# Patient Record
Sex: Male | Born: 1960 | Race: White | Hispanic: No | Marital: Married | Smoking: Former smoker
Health system: Southern US, Community
[De-identification: ages and names within clinical notes are randomized; demographics above are authoritative.]

## PROBLEM LIST (undated history)

## (undated) DIAGNOSIS — D126 Benign neoplasm of colon, unspecified: Secondary | ICD-10-CM

## (undated) DIAGNOSIS — M545 Low back pain, unspecified: Secondary | ICD-10-CM

## (undated) DIAGNOSIS — K5790 Diverticulosis of intestine, part unspecified, without perforation or abscess without bleeding: Secondary | ICD-10-CM

## (undated) DIAGNOSIS — K219 Gastro-esophageal reflux disease without esophagitis: Secondary | ICD-10-CM

## (undated) DIAGNOSIS — N419 Inflammatory disease of prostate, unspecified: Secondary | ICD-10-CM

## (undated) DIAGNOSIS — E785 Hyperlipidemia, unspecified: Secondary | ICD-10-CM

## (undated) DIAGNOSIS — E039 Hypothyroidism, unspecified: Secondary | ICD-10-CM

## (undated) DIAGNOSIS — K649 Unspecified hemorrhoids: Secondary | ICD-10-CM

## (undated) DIAGNOSIS — T7840XA Allergy, unspecified, initial encounter: Secondary | ICD-10-CM

## (undated) DIAGNOSIS — Q393 Congenital stenosis and stricture of esophagus: Secondary | ICD-10-CM

## (undated) HISTORY — DX: Low back pain: M54.5

## (undated) HISTORY — DX: Allergy, unspecified, initial encounter: T78.40XA

## (undated) HISTORY — DX: Gastro-esophageal reflux disease without esophagitis: K21.9

## (undated) HISTORY — DX: Congenital stenosis and stricture of esophagus: Q39.3

## (undated) HISTORY — DX: Benign neoplasm of colon, unspecified: D12.6

## (undated) HISTORY — DX: Inflammatory disease of prostate, unspecified: N41.9

## (undated) HISTORY — DX: Diverticulosis of intestine, part unspecified, without perforation or abscess without bleeding: K57.90

## (undated) HISTORY — DX: Unspecified hemorrhoids: K64.9

## (undated) HISTORY — DX: Hyperlipidemia, unspecified: E78.5

## (undated) HISTORY — DX: Hypothyroidism, unspecified: E03.9

## (undated) HISTORY — DX: Low back pain, unspecified: M54.50

---

## 2005-09-24 ENCOUNTER — Emergency Department (HOSPITAL_COMMUNITY): Admission: EM | Admit: 2005-09-24 | Discharge: 2005-09-24 | Payer: Self-pay | Admitting: Emergency Medicine

## 2005-10-26 ENCOUNTER — Emergency Department (HOSPITAL_COMMUNITY): Admission: EM | Admit: 2005-10-26 | Discharge: 2005-10-26 | Payer: Self-pay | Admitting: Emergency Medicine

## 2006-03-07 HISTORY — PX: SHOULDER SURGERY: SHX246

## 2007-04-12 ENCOUNTER — Ambulatory Visit (HOSPITAL_COMMUNITY): Admission: RE | Admit: 2007-04-12 | Discharge: 2007-04-12 | Payer: Self-pay | Admitting: Orthopedic Surgery

## 2009-08-05 DIAGNOSIS — E785 Hyperlipidemia, unspecified: Secondary | ICD-10-CM

## 2009-08-05 HISTORY — DX: Hyperlipidemia, unspecified: E78.5

## 2009-08-17 ENCOUNTER — Ambulatory Visit: Payer: Self-pay | Admitting: Internal Medicine

## 2009-08-17 DIAGNOSIS — S46909A Unspecified injury of unspecified muscle, fascia and tendon at shoulder and upper arm level, unspecified arm, initial encounter: Secondary | ICD-10-CM | POA: Insufficient documentation

## 2009-08-17 DIAGNOSIS — S4980XA Other specified injuries of shoulder and upper arm, unspecified arm, initial encounter: Secondary | ICD-10-CM | POA: Insufficient documentation

## 2009-08-17 DIAGNOSIS — N410 Acute prostatitis: Secondary | ICD-10-CM | POA: Insufficient documentation

## 2009-08-17 DIAGNOSIS — M545 Low back pain, unspecified: Secondary | ICD-10-CM | POA: Insufficient documentation

## 2009-08-17 DIAGNOSIS — J309 Allergic rhinitis, unspecified: Secondary | ICD-10-CM | POA: Insufficient documentation

## 2009-09-01 ENCOUNTER — Ambulatory Visit: Payer: Self-pay | Admitting: Internal Medicine

## 2009-09-01 LAB — CONVERTED CEMR LAB
OCCULT 1: NEGATIVE
OCCULT 2: NEGATIVE

## 2009-09-02 ENCOUNTER — Ambulatory Visit: Payer: Self-pay | Admitting: Internal Medicine

## 2009-09-02 ENCOUNTER — Encounter (INDEPENDENT_AMBULATORY_CARE_PROVIDER_SITE_OTHER): Payer: Self-pay | Admitting: *Deleted

## 2009-09-14 ENCOUNTER — Ambulatory Visit: Payer: Self-pay | Admitting: Internal Medicine

## 2009-09-14 DIAGNOSIS — E039 Hypothyroidism, unspecified: Secondary | ICD-10-CM | POA: Insufficient documentation

## 2009-09-14 DIAGNOSIS — E785 Hyperlipidemia, unspecified: Secondary | ICD-10-CM | POA: Insufficient documentation

## 2009-09-14 LAB — CONVERTED CEMR LAB
HDL goal, serum: 40 mg/dL
LDL Goal: 160 mg/dL

## 2009-09-23 ENCOUNTER — Encounter: Payer: Self-pay | Admitting: Internal Medicine

## 2009-11-23 ENCOUNTER — Ambulatory Visit: Payer: Self-pay | Admitting: Internal Medicine

## 2010-02-26 ENCOUNTER — Ambulatory Visit: Payer: Self-pay | Admitting: Internal Medicine

## 2010-02-26 DIAGNOSIS — M549 Dorsalgia, unspecified: Secondary | ICD-10-CM | POA: Insufficient documentation

## 2010-02-26 LAB — CONVERTED CEMR LAB
Bilirubin Urine: NEGATIVE
Glucose, Urine, Semiquant: NEGATIVE
Ketones, urine, test strip: NEGATIVE
Specific Gravity, Urine: <1.005
pH: 5

## 2010-03-02 LAB — CONVERTED CEMR LAB: TSH: 1.75 microintl units/mL (ref 0.35–5.50)

## 2010-04-04 LAB — CONVERTED CEMR LAB
Albumin: 4.4 g/dL (ref 3.5–5.2)
BUN: 18 mg/dL (ref 6–23)
Basophils Absolute: 0 10*3/uL (ref 0.0–0.1)
Basophils Relative: 0.7 % (ref 0.0–3.0)
Bilirubin, Direct: 0.1 mg/dL (ref 0.0–0.3)
CO2: 31 meq/L (ref 19–32)
Calcium: 9.4 mg/dL (ref 8.4–10.5)
Chloride: 106 meq/L (ref 96–112)
Cholesterol: 193 mg/dL (ref 0–200)
Creatinine, Ser: 1 mg/dL (ref 0.4–1.5)
Eosinophils Absolute: 0.3 10*3/uL (ref 0.0–0.7)
Glucose, Bld: 88 mg/dL (ref 70–99)
HDL: 49.4 mg/dL (ref >39.00)
Lymphocytes Relative: 24.4 % (ref 12.0–46.0)
MCHC: 34.6 g/dL (ref 30.0–36.0)
MCV: 89.7 fL (ref 78.0–100.0)
Monocytes Absolute: 0.4 10*3/uL (ref 0.1–1.0)
Neutro Abs: 3.4 10*3/uL (ref 1.4–7.7)
Neutrophils Relative %: 63 % (ref 43.0–77.0)
PSA: 2.1 ng/mL (ref 0.10–4.00)
RBC: 4.71 M/uL (ref 4.22–5.81)
RDW: 13.8 % (ref 11.5–14.6)
Total CHOL/HDL Ratio: 4
Total Protein: 7 g/dL (ref 6.0–8.3)
Triglycerides: 78 mg/dL (ref 0.0–149.0)

## 2010-04-06 NOTE — Letter (Signed)
Summary: [facility]  [facility]   Imported By: Lanelle Bal 10/06/2009 11:34:48  _____________________________________________________________________  External Attachment:    Type:   Image     Comment:   External Document

## 2010-04-06 NOTE — Assessment & Plan Note (Signed)
Summary: FOLLOWUP LABS/KN   Vital Signs:  Patient profile:   50 year old male Weight:      172.8 pounds Pulse rate:   54 / minute Resp:     14 per minute BP sitting:   116 / 70  (left arm) Cuff size:   large  Vitals Entered By: Shonna Chock (September 14, 2009 9:07 AM) CC: Follow-up visit: discuss labs , Lipid Management   CC:  Follow-up visit: discuss labs  and Lipid Management.  History of Present Illness: Labs reviewed ; TSH has climbed from 6.36 to 9.22. Free T4 0.78.Weight up 3 # since last appt. See ROS concerning endocrine symptoms/ signs.  Lipid Management History:      Positive NCEP/ATP III risk factors include male age 62 years old or older.  Negative NCEP/ATP III risk factors include non-diabetic, no family history for ischemic heart disease, non-tobacco-user status, non-hypertensive, no ASHD (atherosclerotic heart disease), no prior stroke/TIA, no peripheral vascular disease, and no history of aortic aneurysm.     Allergies (verified): No Known Drug Allergies  Past History:  Past Medical History: Allergic rhinitis Prostatitis , PMH of, Dr Darvin Neighbours HyperlipidemiaLDL 128,HDL 49.40,TG 78, TC 193;[place] Study LDL goal = < 160.  Review of Systems General:  Complains of fatigue. Eyes:  Denies blurring, double vision, and vision loss-both eyes. ENT:  Denies hoarseness; Slight swallowing issues, even saliva. Rare food dysphagia.. CV:  Denies palpitations. GI:  Complains of constipation; denies diarrhea. Derm:  Denies changes in nail beds, dryness, and hair loss. Neuro:  Denies numbness and tingling. Endo:  Denies cold intolerance and heat intolerance.  Physical Exam  General:  Thin but well-nourished; alert,appropriate and cooperative throughout examination Eyes:  No corneal or conjunctival inflammation noted. Perrla.Minimal lid lag . FOV WNL Neck:  No deformities, masses, or tenderness noted. Thyroid ULN size w/o nodules Heart:  regular rhythm, no murmur, no  gallop, no rub, no JVD, and bradycardia.   Neurologic:  alert & oriented X3 and DTRs symmetrical and 1/2+ Skin:  Intact without suspicious lesions or rashes Psych:  memory intact for recent and remote, normally interactive, and good eye contact.     Impression & Recommendations:  Problem # 1:  HYPOTHYROIDISM (ICD-244.9)  His updated medication list for this problem includes:    Levothyroxine Sodium 25 Mcg Tabs (Levothyroxine sodium) .Marland Kitchen... 1 once daily  Complete Medication List: 1)  Levothyroxine Sodium 25 Mcg Tabs (Levothyroxine sodium) .Marland Kitchen.. 1 once daily  Lipid Assessment/Plan:      Based on NCEP/ATP III, the patient's risk factor category is "0-1 risk factors".  The patient's lipid goals are as follows: Total cholesterol goal is 200; LDL cholesterol goal is 160; HDL cholesterol goal is 40; Triglyceride goal is 150.  His LDL cholesterol goal has been met.    Patient Instructions: 1)  Please schedule a follow-up Lab  appointment in  10 weeks for a 2)  TSH , ICD-9:244.9. TSH goal = 1-3 Prescriptions: LEVOTHYROXINE SODIUM 25 MCG TABS (LEVOTHYROXINE SODIUM) 1 once daily  #90 x 1   Entered and Authorized by:   Marga Melnick MD   Signed by:   Marga Melnick MD on 09/14/2009   Method used:   Print then Give to Patient   RxID:   [id number]

## 2010-04-06 NOTE — Letter (Signed)
Summary: [facility]  [facility]   Imported By: Lanelle Bal 10/01/2009 12:05:24  _____________________________________________________________________  External Attachment:    Type:   Image     Comment:   External Document
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
## 2010-04-06 NOTE — Letter (Signed)
Summary: Results Follow up Letter  [place] [facility]  [address] [place], [place] [address]   Phone: [phone]  Fax: [phone]    09/02/2009 MRN: [id number]  [facility] [address] [place], [place]  [address]  Dear Gregory Webb,  The following are the results of your recent test(s):  Test         Result    Pap Smear:        Normal _____  Not Normal _____ Comments: ______________________________________________________ Cholesterol: LDL(Bad cholesterol):         Your goal is less than:         HDL (Good cholesterol):       Your goal is more than: Comments:  ______________________________________________________ Mammogram:        Normal _____  Not Normal _____ Comments:  ___________________________________________________________________ Hemoccult:        Normal _X__  Not normal _______ Comments:    _____________________________________________________________________ Other Tests:    We routinely do not discuss normal results over the telephone.  If you desire a copy of the results, or you have any questions about this information we can discuss them at your next office visit.   Sincerely,

## 2010-04-08 NOTE — Assessment & Plan Note (Signed)
Summary: lower back pain//ph   Vital Signs:  Patient profile:   50 year old male Height:      72.25 inches (183.52 cm) Weight:      172.25 pounds (78.30 kg) BMI:     23.28 Temp:     98.8 degrees F (37.11 degrees C) Resp:     15 per minute BP sitting:   100 / 70  (left arm) Cuff size:   regular  Vitals Entered By: Lucious Groves CMA (February 26, 2010 12:35 PM) CC: C/O lower back pain x2 days./kb, Back pain Is Patient Diabetic? No Pain Assessment Patient in pain? yes     Location: back Intensity: 6 Type: aching Onset of pain  2 days Comments Patient notes that OTC motrin has helped. He denies this being a reoccurring issue and injury.   CC:  C/O lower back pain x2 days./kb and Back pain.  History of Present Illness: Back Pain      This is a 50 year old man who presents with Back pain X 2 days w/o trigger or injury.  The patient reports chills 12/22  and rest pain, but denies fever, weakness, loss of sensation, fecal incontinence, urinary incontinence, urinary retention, pyuria, hematuria  or  dysuria.  The pain is located in the right low back.  The pain radiates to the right hip.  The pain is made worse by lying down.  The  R hip pain is made better by cranberry juice.  No PMH of renal calculi , but his sister & father have. PMH of prostatitis;it did not present like this.  Current Medications (verified): 1)  Levothyroxine Sodium 25 Mcg Tabs (Levothyroxine Sodium) .Marland Kitchen.. 1 Once Daily  Allergies (verified): No Known Drug Allergies  Review of Systems Derm:  Denies lesion(s) and rash. Neuro:  Denies disturbances in coordination, numbness, poor balance, and tingling. Heme:  Denies abnormal bruising and bleeding.  Physical Exam  General:  Thin ,in no acute distress; alert,appropriate and cooperative throughout examination Lungs:  Normal respiratory effort, chest expands symmetrically. Lungs are clear to auscultation, no crackles or wheezes. Heart:  Normal rate and regular  rhythm. S1 and S2 normal without gallop, murmur, click, rub or other extra sounds. Abdomen:  Bowel sounds positive,abdomen soft and non-tender without masses, organomegaly  noted. No AAA . Minor weakness R inguinal area Genitalia:  Testes bilaterally descended without nodularity, tenderness or masses. No scrotal masses or lesions. No penis lesions or urethral discharge. L varicocele.   Prostate:  Classic prostatitis on DRE Msk:  No deformity or scoliosis noted of thoracic or lumbar spine.  No pain to percussion.Marland Kitchen He lay & sat up w/o help. He can touch toes. Neg SLR Pulses:  R and L dorsalis pedis and posterior tibial pulses are full and equal bilaterally Extremities:  No clubbing, cyanosis, edema, or deformity noted with normal full range of motion of all joints. Mild fungal nail changes of toenails   Neurologic:  alert & oriented X3, strength normal in all extremities,  heel & toe gait normal, and DTRs symmetrical and normal.   Skin:  Intact without suspicious lesions or rashes Inguinal Nodes:  No significant adenopathy Psych:  memory intact for recent and remote, normally interactive, and good eye contact.     Impression & Recommendations:  Problem # 1:  ACUTE PROSTATITIS (ICD-601.0)  Orders: Venipuncture (29562) TLB-PSA (Prostate Specific Antigen) (84153-PSA)  Problem # 2:  BACK PAIN, RIGHT (ICD-724.5) due to #1  Problem # 3:  HYPOTHYROIDISM (ICD-244.9)  His updated medication list for this problem includes:    Levothyroxine Sodium 25 Mcg Tabs (Levothyroxine sodium) .Marland Kitchen... 1 once daily  Orders: Venipuncture (04540) TLB-TSH (Thyroid Stimulating Hormone) (84443-TSH)  Complete Medication List: 1)  Levothyroxine Sodium 25 Mcg Tabs (Levothyroxine sodium) .Marland Kitchen.. 1 once daily 2)  Ciprofloxacin Hcl 500 Mg Tabs (Ciprofloxacin hcl) .Marland Kitchen.. 1 two times a day  Other Orders: UA Dipstick W/ Micro (manual) (98119)  Patient Instructions: 1)  Recommended remaining out of work for   02/26/2010. 2)  Drink as much fluid as you can tolerate for the next few days. Prescriptions: CIPROFLOXACIN HCL 500 MG TABS (CIPROFLOXACIN HCL) 1 two times a day  #20 x 0   Entered and Authorized by:   Marga Melnick MD   Signed by:   Marga Melnick MD on 02/26/2010   Method used:   Print then Give to Patient   RxID:   [id number] LEVOTHYROXINE SODIUM 25 MCG TABS (LEVOTHYROXINE SODIUM) 1 once daily  #90 x 1   Entered and Authorized by:   Marga Melnick MD   Signed by:   Marga Melnick MD on 02/26/2010   Method used:   Print then Give to Patient   RxID:   [id number]    Orders Added: 1)  UA Dipstick W/ Micro (manual) [81000] 2)  Est. Patient Level IV [01027] 3)  Venipuncture [25366] 4)  TLB-TSH (Thyroid Stimulating Hormone) [84443-TSH] 5)  TLB-PSA (Prostate Specific Antigen) [44034-VQQ]    Laboratory Results   Urine Tests   Date/Time Reported: February 26, 2010 11:59 AM   Routine Urinalysis   Color: straw Appearance: Clear Glucose: negative   (Normal Range: Negative) Bilirubin: negative   (Normal Range: Negative) Ketone: negative   (Normal Range: Negative) Spec. Gravity: <1.005   (Normal Range: 1.003-1.035) Blood: negative   (Normal Range: Negative) pH: 5.0   (Normal Range: 5.0-8.0) Protein: negative   (Normal Range: Negative) Urobilinogen: >=8.0   (Normal Range: 0-1) Nitrite: negative   (Normal Range: Negative) Leukocyte Esterace: negative   (Normal Range: Negative)    Comments: Floydene Flock  February 26, 2010 11:59 AM      Appended Document: lower back pain//ph

## 2010-04-12 ENCOUNTER — Encounter: Payer: Self-pay | Admitting: Internal Medicine

## 2010-04-12 ENCOUNTER — Ambulatory Visit (INDEPENDENT_AMBULATORY_CARE_PROVIDER_SITE_OTHER): Payer: Managed Care, Other (non HMO) | Admitting: Internal Medicine

## 2010-04-12 DIAGNOSIS — R1013 Epigastric pain: Secondary | ICD-10-CM

## 2010-04-12 DIAGNOSIS — Q393 Congenital stenosis and stricture of esophagus: Secondary | ICD-10-CM | POA: Insufficient documentation

## 2010-04-12 DIAGNOSIS — K222 Esophageal obstruction: Secondary | ICD-10-CM | POA: Insufficient documentation

## 2010-04-12 DIAGNOSIS — R1319 Other dysphagia: Secondary | ICD-10-CM

## 2010-04-22 NOTE — Assessment & Plan Note (Signed)
Summary: stonach pain/cbs  Nurse Visit   Vital Signs:  Patient profile:   50 year old male Weight:      174.8 pounds BMI:     23.63 Temp:     98.3 degrees F oral Pulse rate:   64 / minute Resp:     14 per minute BP sitting:   120 / 82  (left arm) Cuff size:   regular  Vitals Entered By: Shonna Chock CMA (April 12, 2010 8:12 AM)  CC:  Stomach pains since last week, little constipation , and Abdominal pain.  History of Present Illness: Abdominal Pain      This is a 50 year old man who presents with Abdominal pain since 01/29 in context of constipation.  The patient  denies nausea, vomiting, diarrhea, melena, hematochezia, and hematemesis.  The location of the pain is epigastric.  The pain is described as intermittent and dull.  The patient denies the following symptoms: fever, weight loss, dysuria, chest pain, jaundice, and dark urine.  The pain had no triggers.  The pain is better with defecation.   He has had meat induced dysphagia X2 ; 1st episode 4-5 months ago & the 2nd 4-6weeks ago. No PMH or FH  of DUD .   Physical Exam  General:  well-nourished,in no acute distress; alert,appropriate and cooperative throughout examination Eyes:  No corneal or conjunctival inflammation noted. No icterus Mouth:  Oral mucosa and oropharynx without lesions or exudates.   Minimal erythema of uvula Neck:  No deformities, masses, or tenderness noted. Lungs:  Normal respiratory effort, chest expands symmetrically. Lungs are clear to auscultation, no crackles or wheezes. Heart:  Normal rate and regular rhythm. S1 and S2 normal without gallop, murmur, click, rub or other extra sounds. Abdomen:  Bowel sounds positive,abdomen soft and non-tender without masses, organomegaly or hernias noted. Skin:  Intact without suspicious lesions or rashes No jaundice Cervical Nodes:  No lymphadenopathy noted Axillary Nodes:  No palpable lymphadenopathy Psych:  memory intact for recent and remote, normally  interactive, and good eye contact.      Impression & Recommendations:  Problem # 1:  ABDOMINAL PAIN, EPIGASTRIC (ICD-789.06) Probable GERD  Orders: Gastroenterology Referral (GI)  Problem # 2:  OTHER DYSPHAGIA (ICD-787.29)  X2 in past 5 months  Orders: Gastroenterology Referral (GI)  Complete Medication List: 1)  Levothyroxine Sodium 25 Mcg Tabs (Levothyroxine sodium) .Marland Kitchen.. 1 once daily 2)  Ranitidine Hcl 150 Mg Tabs (Ranitidine hcl) .Marland Kitchen.. 1 two times a day pre meals   Patient Instructions: 1)  Discuss screening colonoscopy when seen by Gastroenterologist. 2)  Avoid foods high in acid (tomatoes, citrus juices, spicy foods). Avoid eating within two hours of lying down or before exercising. Do not over eat; try smaller more frequent meals. Elevate head of bed twelve inches when sleeping.  CC: Stomach pains since last week, little constipation , Abdominal pain   Current Medications (verified): 1)  Levothyroxine Sodium 25 Mcg Tabs (Levothyroxine Sodium) .Marland Kitchen.. 1 Once Daily  Allergies (verified): No Known Drug Allergies  Orders Added: 1)  Est. Patient Level III [95284] 2)  Gastroenterology Referral [GI] Prescriptions: RANITIDINE HCL 150 MG TABS (RANITIDINE HCL) 1 two times a day pre meals  #60 x 1   Entered and Authorized by:   Marga Melnick MD   Signed by:   Marga Melnick MD on 04/12/2010   Method used:   Electronically to        [facility]  [address] [phone]* (retail)  [address]       [place], [place]  [address]       Ph: [phone] or [phone]       Fax: [phone]   RxID:   [id number]

## 2010-05-20 ENCOUNTER — Ambulatory Visit (INDEPENDENT_AMBULATORY_CARE_PROVIDER_SITE_OTHER): Payer: Managed Care, Other (non HMO) | Admitting: Internal Medicine

## 2010-05-20 ENCOUNTER — Encounter: Payer: Self-pay | Admitting: Internal Medicine

## 2010-05-20 DIAGNOSIS — R1319 Other dysphagia: Secondary | ICD-10-CM

## 2010-05-20 DIAGNOSIS — R1013 Epigastric pain: Secondary | ICD-10-CM

## 2010-05-20 DIAGNOSIS — Z1211 Encounter for screening for malignant neoplasm of colon: Secondary | ICD-10-CM

## 2010-05-25 NOTE — Assessment & Plan Note (Signed)
Summary: epigastric abd pain sch w renee cigna-ins copay and cx fee ad...   History of Present Illness Visit Type: Initial Consult Primary GI MD: Stan Head MD [facility] Primary Provider: Pecola Lawless, MD  Requesting Provider: Pecola Lawless, MD  Chief Complaint: Upper abd pain  History of Present Illness:   50 yo wm with epigastric pain and dysphagia. Pressure-like pain in epigastrium. Problems started in late January. Dysphagia with intermittent meat, subxiphoid sticking point and pain. Happened three times in 6 months and not before. Once in the last month. Raniditine has been started and no more abdominal pain.  There is mild chronic constipation, occasionally.  6 cups coffee daily to keep awake on third shift.   GI Review of Systems    Reports abdominal pain, acid reflux, dysphagia with solids, and  heartburn.     Location of  Abdominal pain: upper abdomen.    Denies belching, bloating, chest pain, dysphagia with liquids, loss of appetite, nausea, vomiting, vomiting blood, weight loss, and  weight gain.      Reports constipation.     Denies anal fissure, black tarry stools, change in bowel habit, diarrhea, diverticulosis, fecal incontinence, heme positive stool, hemorrhoids, irritable bowel syndrome, jaundice, light color stool, liver problems, rectal bleeding, and  rectal pain.    Current Medications (verified): 1)  Levothyroxine Sodium 25 Mcg Tabs (Levothyroxine Sodium) .Marland Kitchen.. 1 Once Daily 2)  Ranitidine Hcl 150 Mg Tabs (Ranitidine Hcl) .Marland Kitchen.. 1 Tablet By Mouth Once Daily  Allergies (verified): No Known Drug Allergies  Past History:  Past Medical History: Allergic rhinitis Prostatitis , PMH of, Dr Darvin Neighbours HyperlipidemiaLDL 128,HDL 49.40,TG 78, TC 193;[place] Study LDL goal = < 160. GERD Hypothyroidism  Past Surgical History: Reviewed history from 08/17/2009 and no changes required. SHOULDER INJURY, RIGHT (ICD-959.2), S/P repair [facility] 2009    Family  History: Father: Living Mother: lipids, breast cancer Siblings: 2 Sisters, 1 Brother: negative; PGM colon cancer : Family History of Colon Cancer:PGM  Social History: Occupation:Lead Person Ecologist Married Children Former Smoker: quit 1986 Alcohol use-yes: occasionally Regular exercise-yes: on [employer], training 2X/week Daily Caffeine Use: 6/day cutting back (coffee mostly)  Review of Systems       The patient complains of allergy/sinus.         All other ROS negative except as per HPI.   Vital Signs:  Patient profile:   50 year old male Height:      72.25 inches Weight:      172 pounds BMI:     23.25 BSA:     2.00 Pulse rate:   88 / minute Pulse rhythm:   regular BP sitting:   124 / 76  (left arm) Cuff size:   regular  Vitals Entered By: Ok Anis CMA (May 20, 2010 1:47 PM)  Physical Exam  General:  Well developed, well nourished, no acute distress. Eyes:  PERRLA, no icterus. Mouth:  No deformity or lesions, dentition normal. Neck:  Supple; no masses or thyromegaly. Lungs:  Clear throughout to auscultation. Heart:  Regular rate and rhythm; no murmurs, rubs,  or bruits. Abdomen:  Soft, nontender and nondistended. No masses, hepatosplenomegaly or hernias noted. Normal bowel sounds. Rectal:  deferred until time of colonoscopy.   Extremities:  No clubbing, cyanosis, edema or deformities noted. Neurologic:  Alert and  oriented x 3 Cervical Nodes:  No significant cervical or supraclavicular adenopathy.  Psych:  Alert and cooperative. Normal mood and affect.   Impression &  Recommendations:  Problem # 1:  OTHER DYSPHAGIA (ICD-787.29)  NEW intermittent solid dysphagia new in past few months. ? stricture/ring, seems like GERD related problem most likely  Risks, benefits,and indications of endoscopic procedure(s) were reviewed with the patient and all questions answered.  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  Orders: Endo [facility]  Dil/Colon (Endo Sav Dil/Col)  Patient Instructions: 1)  Pick up your prep from your pharmacy.  2)  Colonoscopy and Flexible Sigmoidoscopy brochure given.  3)  Upper Endoscopy with Dilatation brochure given.  4)  GERD diet handout given to patient.  5)  Copy sent to : Marga Melnick, MD 6)  The medication list was reviewed and reconciled.  All changed / newly prescribed medications were explained.  A complete medication list was provided to the patient / caregiver. Prescriptions: MOVIPREP 100 GM  SOLR (PEG-KCL-NACL-NASULF-NA ASC-C) As per prep instructions.  #1 x 0   Entered by:   Christie Nottingham CMA (AAMA)   Authorized by:   Iva Boop MD, [facility]   Signed by:   Iva Boop MD, FACG on 05/20/2010   Method used:   [facility]ron[facility] to        [phone]       [address]       [place], [place]  [address]       Ph: [phone] or [phone]       Fax: [phone]   RxID:   [id number]

## 2010-05-25 NOTE — Letter (Signed)
Summary: [facility] Instructions  [facility]  [address] [place], [place] [address]   Phone: [phone]  Fax: [phone]       OBRIAN BULSON    [birth year]    MRN: [id number]        Procedure Day /Date: Monday April 2nd, 2012       Arrival Time: 2:30pm      Procedure Time: 3:30pm     Location of Procedure:                    _ x_  [facility] (4th Floor)                        PREPARATION FOR COLONOSCOPY WITH MOVIPREP   Starting 5 days prior to your procedure 06/03/10 do not eat nuts, seeds, popcorn, corn, beans, peas,  salads, or any raw vegetables.  Do not take any fiber supplements (e.g. Metamucil, Citrucel, and Benefiber).  THE DAY BEFORE YOUR PROCEDURE         DATE: 06/06/10  DAY: Sunday  1.  Drink clear liquids the entire day-NO SOLID FOOD  2.  Do not drink anything colored red or purple.  Avoid juices with pulp.  No orange juice.  3.  Drink at least 64 oz. (8 glasses) of fluid/clear liquids during the day to prevent dehydration and help the prep work efficiently.  CLEAR LIQUIDS INCLUDE: Water Jello Ice Popsicles Tea (sugar ok, no milk/cream) Powdered fruit flavored drinks Coffee (sugar ok, no milk/cream) Gatorade Juice: apple, white grape, white cranberry  Lemonade Clear bullion, consomm, broth Carbonated beverages (any kind) Strained chicken noodle soup Hard Candy                             4.  In the morning, mix first dose of MoviPrep solution:    Empty 1 Pouch A and 1 Pouch B into the disposable container    Add lukewarm drinking water to the top line of the container. Mix to dissolve    Refrigerate (mixed solution should be used within 24 hrs)  5.  Begin drinking the prep at 5:00 p.m. The MoviPrep container is divided by 4 marks.   Every 15 minutes drink the solution down to the next mark (approximately 8 oz) until the full liter is complete.   6.  Follow completed prep with 16 oz of clear liquid of your choice  (Nothing red or purple).  Continue to drink clear liquids until bedtime.  7.  Before going to bed, mix second dose of MoviPrep solution:    Empty 1 Pouch A and 1 Pouch B into the disposable container    Add lukewarm drinking water to the top line of the container. Mix to dissolve    Refrigerate  THE DAY OF YOUR PROCEDURE      DATE: 06/07/10 DAY: Monday  Beginning at 10:30 a.m. (5 hours before procedure):         1. Every 15 minutes, drink the solution down to the next mark (approx 8 oz) until the full liter is complete.  2. Follow completed prep with 16 oz. of clear liquid of your choice.    3. You may drink clear liquids until 1:30pm (2 HOURS BEFORE PROCEDURE).   MEDICATION INSTRUCTIONS  Unless otherwise instructed, you should take regular prescription medications with a small sip of water   as early as possible the  morning of your procedure.         OTHER INSTRUCTIONS  You will need a responsible adult at least 51 years of age to accompany you and drive you home.   This person must remain in the waiting room during your procedure.  Wear loose fitting clothing that is easily removed.  Leave jewelry and other valuables at home.  However, you may wish to bring a book to read or  an iPod/MP3 player to listen to music as you wait for your procedure to start.  Remove all body piercing jewelry and leave at home.  Total time from sign-in until discharge is approximately 2-3 hours.  You should go home directly after your procedure and rest.  You can resume normal activities the  day after your procedure.  The day of your procedure you should not:   Drive   Make legal decisions   Operate machinery   Drink alcohol   Return to work  You will receive specific instructions about eating, activities and medications before you leave.    The above instructions have been reviewed and explained to me by   _______________________    I fully understand and can verbalize  these instructions _____________________________ Date _________

## 2010-06-06 DIAGNOSIS — Q393 Congenital stenosis and stricture of esophagus: Secondary | ICD-10-CM

## 2010-06-06 DIAGNOSIS — K222 Esophageal obstruction: Secondary | ICD-10-CM

## 2010-06-06 HISTORY — DX: Congenital stenosis and stricture of esophagus: Q39.3

## 2010-06-06 HISTORY — DX: Esophageal obstruction: K22.2

## 2010-06-07 ENCOUNTER — Encounter: Payer: Self-pay | Admitting: Internal Medicine

## 2010-06-07 ENCOUNTER — Ambulatory Visit (AMBULATORY_SURGERY_CENTER): Payer: Managed Care, Other (non HMO) | Admitting: Internal Medicine

## 2010-06-07 VITALS — BP 112/81 | HR 62 | Temp 97.9°F | Resp 16 | Ht 72.0 in | Wt 173.0 lb

## 2010-06-07 DIAGNOSIS — Z1211 Encounter for screening for malignant neoplasm of colon: Secondary | ICD-10-CM

## 2010-06-07 DIAGNOSIS — D126 Benign neoplasm of colon, unspecified: Secondary | ICD-10-CM

## 2010-06-07 DIAGNOSIS — K2970 Gastritis, unspecified, without bleeding: Secondary | ICD-10-CM

## 2010-06-07 DIAGNOSIS — R1310 Dysphagia, unspecified: Secondary | ICD-10-CM

## 2010-06-07 DIAGNOSIS — K648 Other hemorrhoids: Secondary | ICD-10-CM

## 2010-06-07 DIAGNOSIS — K219 Gastro-esophageal reflux disease without esophagitis: Secondary | ICD-10-CM

## 2010-06-07 DIAGNOSIS — R933 Abnormal findings on diagnostic imaging of other parts of digestive tract: Secondary | ICD-10-CM

## 2010-06-07 DIAGNOSIS — R1319 Other dysphagia: Secondary | ICD-10-CM

## 2010-06-07 DIAGNOSIS — K2990 Gastroduodenitis, unspecified, without bleeding: Secondary | ICD-10-CM

## 2010-06-07 DIAGNOSIS — K5730 Diverticulosis of large intestine without perforation or abscess without bleeding: Secondary | ICD-10-CM

## 2010-06-07 DIAGNOSIS — K222 Esophageal obstruction: Secondary | ICD-10-CM

## 2010-06-07 NOTE — Patient Instructions (Addendum)
Clear liquids until 530 PM today, then soft foods rest of day. Start normal consistency foods tomorrow.  Continue with the same medications you are on. You may need stronger acid-blocking medication (currently on ranitidine) depending upon how things go. If you get recurrent swallowing problems or develop persistent abdominal pain again contact Dr. Leone Payor. Handouts for gastritis, dilation diet,hemorrhoids, diverticulosis,high fiber diet, polyps given.

## 2010-06-08 ENCOUNTER — Telehealth: Payer: Self-pay | Admitting: *Deleted

## 2010-06-08 HISTORY — PX: COLONOSCOPY: SHX174

## 2010-06-08 HISTORY — PX: UPPER GASTROINTESTINAL ENDOSCOPY: SHX188

## 2010-06-08 NOTE — Telephone Encounter (Signed)
No message left no id on answering machine

## 2010-06-15 ENCOUNTER — Encounter: Payer: Self-pay | Admitting: Internal Medicine

## 2010-06-15 NOTE — Progress Notes (Signed)
Quick Note:  One adenoma in colon so recall 2017 five yrs No recall egd see letter also ______

## 2010-06-23 ENCOUNTER — Encounter: Payer: Self-pay | Admitting: *Deleted

## 2010-06-23 ENCOUNTER — Telehealth: Payer: Self-pay | Admitting: Internal Medicine

## 2010-06-23 NOTE — Telephone Encounter (Signed)
Pt wants to know if he is still supposed to take his reflux medication, states he is still having a lot of pressure in his stomach. Dr. Leone Payor please advise.

## 2010-06-24 MED ORDER — RANITIDINE HCL 150 MG PO TABS: 150.0000 mg | ORAL_TABLET | Freq: Two times a day (BID) | ORAL | Status: DC

## 2010-06-24 NOTE — Telephone Encounter (Signed)
Patient advised of Dr. Gessner's recommendations 

## 2010-06-24 NOTE — Telephone Encounter (Signed)
Tell him he can take the ranitidine bid still if it helps - he was trying to avoid chronic meds If ranitidine was not working change to OTC omeprazole or lansoprazole 1 each day  Let him know we had a glitch with Epic conversion and letter not out, one benign polyp - repeat colon 5 years, esophageal bxs ok Letter will go out  Sounds like he may need a follow-up with me in June to review things and make sure he is ok so ask him to arrange

## 2010-06-27 ENCOUNTER — Other Ambulatory Visit: Payer: Self-pay | Admitting: Internal Medicine

## 2010-07-01 ENCOUNTER — Telehealth: Payer: Self-pay | Admitting: Internal Medicine

## 2010-07-01 NOTE — Telephone Encounter (Signed)
Patient c/o continued reflux despite ranitidine.  He is advised per last phone note he was to switch to prilosec or Prevacid.  Patient is also scheduled for a follow up visit 08/09/10 3:45 per last phone note.  He will call back for further problems or questions.

## 2010-07-26 ENCOUNTER — Emergency Department (HOSPITAL_BASED_OUTPATIENT_CLINIC_OR_DEPARTMENT_OTHER)
Admission: EM | Admit: 2010-07-26 | Discharge: 2010-07-26 | Disposition: A | Discharge status: Home / Self Care | Payer: Managed Care, Other (non HMO) | Attending: Emergency Medicine | Admitting: Emergency Medicine

## 2010-07-26 DIAGNOSIS — T6391XA Toxic effect of contact with unspecified venomous animal, accidental (unintentional), initial encounter: Secondary | ICD-10-CM | POA: Insufficient documentation

## 2010-07-26 DIAGNOSIS — T63461A Toxic effect of venom of wasps, accidental (unintentional), initial encounter: Secondary | ICD-10-CM | POA: Insufficient documentation

## 2010-08-09 ENCOUNTER — Ambulatory Visit (INDEPENDENT_AMBULATORY_CARE_PROVIDER_SITE_OTHER): Payer: Managed Care, Other (non HMO) | Admitting: Internal Medicine

## 2010-08-09 ENCOUNTER — Encounter: Payer: Self-pay | Admitting: Internal Medicine

## 2010-08-09 DIAGNOSIS — Z86010 Personal history of colonic polyps: Secondary | ICD-10-CM

## 2010-08-09 DIAGNOSIS — R1319 Other dysphagia: Secondary | ICD-10-CM

## 2010-08-09 DIAGNOSIS — R1013 Epigastric pain: Secondary | ICD-10-CM

## 2010-08-09 DIAGNOSIS — Z860101 Personal history of adenomatous and serrated colon polyps: Secondary | ICD-10-CM | POA: Insufficient documentation

## 2010-08-09 MED ORDER — POLYETHYLENE GLYCOL 3350 17 GM/SCOOP PO POWD: ORAL | Status: DC

## 2010-08-09 MED ORDER — BISACODYL 5 MG PO TBEC: 5.0000 mg | DELAYED_RELEASE_TABLET | Freq: Every day | ORAL | Status: AC | PRN

## 2010-08-09 NOTE — Patient Instructions (Signed)
Take Miralax OTC 17 gm in 8 oz of water every day before sleep.  If you haven't had a bowel movement in 2-3 day then take OTC Dulcolax or Senakot tablets daily for constipation. Return to see Dr. Leone Payor as needed or if symptoms get worse.

## 2010-08-09 NOTE — Assessment & Plan Note (Signed)
He does not have dysphagia at this time, after the dilation. He never had classic heartburn, his epigastric pressure may be related to constipation and not GERD as suspected. Will observe off of H2 blocker and PPI.

## 2010-08-09 NOTE — Progress Notes (Signed)
This is a 50 year old white man I met a few months ago. He had dysphagia, epigastric pain and constipation problems. He had not had a screening colonoscopy.  I dilated a little subtle ring with a 50 [place] dilator and he has not had dysphagia to food since. He never had classic heartburn and does not feel that ranitidine or Prilosec helped his epigastric pain that is more of a pressure like problem. Upon questioning he thinks it may be related to his irregular bowel habits and constipation problems that began after going to third shift 4 years ago. He moves his bowels about 3-4 times a week and there are times when after not having had a bowel movement for 4 days she feels this pressure, he believes. He has not had any unintentional weight loss or bleeding problems. He is not using any laxatives. He thinks he could increase fiber in his diet some.

## 2010-08-09 NOTE — Assessment & Plan Note (Signed)
This is vague and pressure-like. There's been no anemia or weight loss or other worrisome features. Initially it was thought that this might be related to GERD. Given what we know now, I think it may be related to constipation. He will try MiraLax and possible Dulcolax as needed with a regular MiraLax dose. If this fails to work he will call me back.

## 2010-11-16 ENCOUNTER — Telehealth: Payer: Self-pay

## 2010-11-16 NOTE — Telephone Encounter (Signed)
Albuterol metered-dose inhaler 1-2 puffs every 4-6 hours as needed dispense 1

## 2010-11-16 NOTE — Telephone Encounter (Signed)
Pt states that he has hx of seasonal asthma and is currently having sxs. Pt requesting an Rx for an inhaler. Please advise

## 2010-11-17 MED ORDER — IPRATROPIUM-ALBUTEROL 18-103 MCG/ACT IN AERO: 2.0000 | INHALATION_SPRAY | Freq: Four times a day (QID) | RESPIRATORY_TRACT | Status: DC | PRN

## 2010-11-17 NOTE — Telephone Encounter (Signed)
done

## 2010-11-22 ENCOUNTER — Encounter: Payer: Self-pay | Admitting: Internal Medicine

## 2010-11-22 ENCOUNTER — Ambulatory Visit (INDEPENDENT_AMBULATORY_CARE_PROVIDER_SITE_OTHER): Payer: Managed Care, Other (non HMO) | Admitting: Internal Medicine

## 2010-11-22 VITALS — BP 119/77 | HR 52 | Temp 98.8°F | Resp 12 | Ht 73.0 in | Wt 174.2 lb

## 2010-11-22 DIAGNOSIS — Z23 Encounter for immunization: Secondary | ICD-10-CM

## 2010-11-22 DIAGNOSIS — E039 Hypothyroidism, unspecified: Secondary | ICD-10-CM

## 2010-11-22 DIAGNOSIS — Z0000 Encounter for general adult medical examination without abnormal findings: Secondary | ICD-10-CM

## 2010-11-22 LAB — HEPATIC FUNCTION PANEL
ALT: 13 U/L (ref 0–53)
Bilirubin, Direct: 0.1 mg/dL (ref 0.0–0.3)
Total Protein: 7 g/dL (ref 6.0–8.3)

## 2010-11-22 LAB — LIPID PANEL
Cholesterol: 149 mg/dL (ref 0–200)
HDL: 44.6 mg/dL (ref >39.00)
LDL Cholesterol: 95 mg/dL (ref 0–99)
Triglycerides: 49 mg/dL (ref 0.0–149.0)

## 2010-11-22 LAB — CBC WITH DIFFERENTIAL/PLATELET
Eosinophils Relative: 4.2 % (ref 0.0–5.0)
Lymphocytes Relative: 25.7 % (ref 12.0–46.0)
MCV: 90 fl (ref 78.0–100.0)
Monocytes Absolute: 0.4 10*3/uL (ref 0.1–1.0)
Monocytes Relative: 8.1 % (ref 3.0–12.0)
Neutrophils Relative %: 60.9 % (ref 43.0–77.0)
Platelets: 386 10*3/uL (ref 150.0–400.0)
WBC: 4.8 10*3/uL (ref 4.5–10.5)

## 2010-11-22 LAB — BASIC METABOLIC PANEL
BUN: 18 mg/dL (ref 6–23)
Calcium: 9.2 mg/dL (ref 8.4–10.5)
Creatinine, Ser: 0.8 mg/dL (ref 0.4–1.5)
GFR: 103.87 mL/min (ref >60.00)

## 2010-11-22 MED ORDER — TETANUS-DIPHTH-ACELL PERTUSSIS 5-2.5-18.5 LF-MCG/0.5 IM SUSP
0.5000 mL | Freq: Once | INTRAMUSCULAR | Status: AC
  Administered 2010-11-22: 0.5 mL via INTRAMUSCULAR

## 2010-11-22 NOTE — Patient Instructions (Signed)
Preventive Health Care: Exercise at least 30-45 minutes a day,  3-4 days a week.  Eat a low-fat diet with lots of fruits and vegetables, up to 7-9 servings per day. Consume less than 40 grams of sugar per day from foods & drinks with High Fructose Corn Sugar as # 1,2,3 or # 4 on label. Health Care Power of Attorney & Living Will. Complete if not in place ; these place you in charge of your health care decisions. 

## 2010-11-22 NOTE — Progress Notes (Signed)
Subjective:    Patient ID: Gregory Webb, male    DOB: June 03, 1960, 50 y.o.   MRN: [id number]  HPI  Gregory Webb  is here for a physical; he has no acute issues.      Review of Systems Patient reports no  vision/ hearing changes,anorexia, weight change, fever ,adenopathy, persistant / recurrent hoarseness, swallowing issues, chest pain,palpitations, edema,persistant / recurrent cough, hemoptysis, dyspnea(rest, exertional, paroxysmal nocturnal), gastrointestinal  bleeding (melena, rectal bleeding), abdominal pain, excessive heart burn, GU symptoms( dysuria, hematuria, pyuria, voiding/incontinence  issues) syncope, focal weakness, memory loss,numbness & tingling, skin/hair/nail changes,depression, anxiety, abnormal bruising/bleeding,or  musculoskeletal symptoms/signs.      Objective:   Physical Exam Gen.: Thin but  healthy and well-nourished in appearance. Alert, appropriate and cooperative throughout exam. Head: Normocephalic without obvious abnormalities;  pattern alopecia  Eyes: No corneal or conjunctival inflammation noted. Pupils equal round reactive to light and accommodation. Fundal exam is benign without hemorrhages, exudate, papilledema. Extraocular motion intact. Vision grossly normal with lenses. Ears: External  ear exam reveals no significant lesions or deformities. Canals clear .TMs normal. Hearing is grossly normal bilaterally. Nose: External nasal exam reveals no deformity or inflammation. Nasal mucosa are pink and moist. No lesions or exudates noted. Septum dislocated to R Mouth: Oral mucosa and oropharynx reveal no lesions or exudates. Teeth in good repair. Neck: No deformities, masses, or tenderness noted. Range of motion & . Thyroid  normal. Lungs: Normal respiratory effort; chest expands symmetrically. Lungs are clear to auscultation without rales, wheezes, or increased work of breathing. Heart: Slow rate and regular  rhythm. Normal S1 and S2. No gallop, click, or rub. Loud S4  @  [employer] w/o  murmur. Abdomen: Bowel sounds normal; abdomen soft and nontender. No masses, organomegaly or hernias noted. Genitalia/ DRE: Dr Darvin Neighbours   .                                                                                   Musculoskeletal/extremities: No deformity or scoliosis noted of  the thoracic or lumbar spine. No clubbing, cyanosis, edema, or deformity noted. Range of motion  normal .Tone & strength  normal.Joints normal. Nail health  good. Vascular: Carotid, radial artery, dorsalis pedis and  posterior tibial pulses are full and equal. No bruits present. Neurologic: Alert and oriented x3. Deep tendon reflexes symmetrical and normal.          Skin: Intact without suspicious lesions or rashes. Lymph: No cervical, axillary  lymphadenopathy present. Psych: Mood and affect are normal. Normally interactive                                                                                         Assessment & Plan:  #1 comprehensive physical exam; no acute findings #2 see Problem List with Assessments & Recommendations Plan: see Orders  Note: EKG reveals marked sinus bradycardia which was present in June 2011. He is asymptomatic and there are no  EKG changes of ischemia.

## 2010-11-23 ENCOUNTER — Other Ambulatory Visit: Payer: Self-pay | Admitting: Internal Medicine

## 2010-12-13 ENCOUNTER — Other Ambulatory Visit: Payer: Self-pay | Admitting: Dermatology

## 2011-01-14 ENCOUNTER — Other Ambulatory Visit: Payer: Self-pay | Admitting: Internal Medicine

## 2011-01-14 MED ORDER — LEVOTHYROXINE SODIUM 25 MCG PO TABS: ORAL_TABLET | ORAL | Status: DC

## 2011-01-14 NOTE — Telephone Encounter (Signed)
RX sent

## 2011-03-28 ENCOUNTER — Encounter: Payer: Self-pay | Admitting: Internal Medicine

## 2011-03-28 ENCOUNTER — Ambulatory Visit (INDEPENDENT_AMBULATORY_CARE_PROVIDER_SITE_OTHER): Payer: Managed Care, Other (non HMO) | Admitting: Internal Medicine

## 2011-03-28 VITALS — BP 110/80 | HR 68 | Temp 98.3°F | Ht 73.0 in | Wt 179.0 lb

## 2011-03-28 DIAGNOSIS — M545 Low back pain, unspecified: Secondary | ICD-10-CM

## 2011-03-28 DIAGNOSIS — M542 Cervicalgia: Secondary | ICD-10-CM

## 2011-03-28 MED ORDER — CYCLOBENZAPRINE HCL 5 MG PO TABS: ORAL_TABLET | ORAL | Status: AC

## 2011-03-28 MED ORDER — TRAMADOL HCL 50 MG PO TABS: 50.0000 mg | ORAL_TABLET | Freq: Four times a day (QID) | ORAL | Status: AC | PRN

## 2011-03-28 NOTE — Patient Instructions (Addendum)
Use a cervical memory foam pillow to prevent hyperextension or hyperflexion of the cervical spine.  The best exercises for the low back include freestyle swimming, stretch aerobics, and yoga.

## 2011-03-28 NOTE — Progress Notes (Signed)
  Subjective:    Patient ID: Gregory Webb, male    DOB: September 05, 1960, 51 y.o.   MRN: [id number]  HPI BACK PAIN: Location: LS  Quality:sharp  Onset: 2-3 months ago Worse with:prolonged sitting Better with: NSAIDS help temporarily Radiation: no Trauma: no trigger Best sitting/standing/leaning forward: no  Red Flags Fecal/urinary incontinence: no  Numbness/Weakness: no  Fever/chills/sweats: no  Night pain: no  Unexplained weight loss: no   h/o cancer/immunosuppression: no   PMH of osteoporosis or chronic steroid use: no       Review of Systems   He denies hematuria, pyuria, or dysuria. He has no difficulty initiating stream or voiding issues.     Objective:   Physical Exam abdomen is soft without organomegaly or masses. There is no aortic aneurysm or renal artery bruits.  There is no pain to percussion of the lumbosacral spine  He is able to walk on his tip toes and heels without difficulty  He is able to lie flat and sit up without help. Straight leg raising is negative to 90.  Skin is clear with no suspicious rashes or lesions       Assessment & Plan:    #2 low back pain syndrome, most likely related to body habitus. The muscle relaxant prescribed the neck will help. Additionally,Tramadol  will be ordered. There is no indication for radiation exposure with imaging at this time.  Preventative will be regular exercises 3-4 times a week for the back.  If symptoms persist , Chiropractry  would be recommended.

## 2011-03-28 NOTE — Progress Notes (Signed)
Addended byPecola Lawless on: 03/28/2011 01:42 PM   Modules accepted: Orders, Level of Service

## 2011-03-28 NOTE — Progress Notes (Signed)
  Subjective:    Patient ID: Gregory Webb, male    DOB: 04-18-1960, 51 y.o.   MRN: [id number]  HPI NECK PAIN: Location:base of neck   Onset: 01/15/ 2013 upon arising; ? "slept wrong"   Severity: up to 4 Pain is described as: throbbing  Worse with:no factors    Better with:heat temporarily  Pain radiates to: no  Impaired range of motion: no but "creaking"  History of repetitive motion:  no  History of overuse or hyperextension:  no  History of trauma:  no   Past history of similar problem:  yes, 1.5 years ago in same setting   Symptoms Back Pain:  no  Numbness/tingling:  no  Weakness:  no  Red Flags Fever:  no  Headache:  No but also non associated frontal ha Bowel/bladder dysfunction:  no      Review of Systems     Objective:   Physical Exam  Gen. appearance: thin but well-nourished, in no distress Eyes: Extraocular motion intact, field of vision normal,  no nystagmus Neck: Normal range of motion, no masses Muscle skeletal: Range of motion, tone, &  strength normal Neuro:no cranial nerve deficit, deep tendon  reflexes normal, gait normal Lymph: No cervical or axillary LA Skin: Warm and dry without suspicious lesions or rashes Psych: no anxiety or mood change. Normally interactive and cooperative.         Assessment & Plan:  #1 neck pain related to sleeping position. No neuromuscular deficit on exam  Plan: See orders and recommendations

## 2011-10-19 DIAGNOSIS — N400 Enlarged prostate without lower urinary tract symptoms: Secondary | ICD-10-CM | POA: Insufficient documentation

## 2012-03-28 ENCOUNTER — Telehealth: Payer: Self-pay | Admitting: Internal Medicine

## 2012-03-28 NOTE — Telephone Encounter (Signed)
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 Pt denies any SOB, nausea, or sweating.  Reviewed Health History In EMR: Yes  Reviewed Medications In EMR: Yes  Reviewed Allergies In EMR: Yes  Reviewed Surgeries / Procedures: Yes  Date of Onset of Symptoms: 03/26/2012  Guideline(s) Used:  Abdominal Pain - Male  Disposition Per Guideline:   See Today in Office  Reason For Disposition Reached:   Patient wants to be seen  Advice Given:  N/A  RN Overrode Recommendation:  Make Appointment  No appt found this late afternoon; appt scheduled for first available appt in the AM (no appt available for Dr Alwyn Ren)  Appointment Scheduled:  03/29/2012 09:00:00 Appointment Scheduled Provider:  Sheliah Hatch.

## 2012-03-29 ENCOUNTER — Encounter: Payer: Self-pay | Admitting: Family Medicine

## 2012-03-29 ENCOUNTER — Telehealth: Payer: Self-pay | Admitting: *Deleted

## 2012-03-29 ENCOUNTER — Ambulatory Visit (INDEPENDENT_AMBULATORY_CARE_PROVIDER_SITE_OTHER): Payer: Managed Care, Other (non HMO) | Admitting: Family Medicine

## 2012-03-29 VITALS — BP 130/78 | HR 64 | Temp 97.7°F | Wt 176.0 lb

## 2012-03-29 DIAGNOSIS — R1013 Epigastric pain: Secondary | ICD-10-CM | POA: Insufficient documentation

## 2012-03-29 LAB — HEPATIC FUNCTION PANEL
ALT: 14 U/L (ref 0–53)
AST: 17 U/L (ref 0–37)
Bilirubin, Direct: 0 mg/dL (ref 0.0–0.3)
Total Bilirubin: 0.6 mg/dL (ref 0.3–1.2)

## 2012-03-29 LAB — CBC WITH DIFFERENTIAL/PLATELET
Basophils Absolute: 0 10*3/uL (ref 0.0–0.1)
Eosinophils Absolute: 0.3 10*3/uL (ref 0.0–0.7)
Lymphocytes Relative: 29.7 % (ref 12.0–46.0)
MCHC: 33.9 g/dL (ref 30.0–36.0)
Neutrophils Relative %: 55.2 % (ref 43.0–77.0)
RBC: 4.66 Mil/uL (ref 4.22–5.81)
RDW: 13.7 % (ref 11.5–14.6)

## 2012-03-29 LAB — BASIC METABOLIC PANEL
Chloride: 104 mEq/L (ref 96–112)
Potassium: 3.5 mEq/L (ref 3.5–5.1)

## 2012-03-29 MED ORDER — OMEPRAZOLE 20 MG PO CPDR: 20.0000 mg | DELAYED_RELEASE_CAPSULE | Freq: Every day | ORAL | Status: DC

## 2012-03-29 MED ORDER — OMEPRAZOLE 20 MG PO CPDR: 20.0000 mg | DELAYED_RELEASE_CAPSULE | Freq: Two times a day (BID) | ORAL | Status: DC

## 2012-03-29 MED ORDER — METRONIDAZOLE 500 MG PO TABS: 500.0000 mg | ORAL_TABLET | Freq: Three times a day (TID) | ORAL | Status: DC

## 2012-03-29 MED ORDER — BISMUTH SUBSALICYLATE 525 MG/15ML PO SUSP: 525.0000 mg | Freq: Three times a day (TID) | ORAL | Status: DC

## 2012-03-29 MED ORDER — LEVOTHYROXINE SODIUM 25 MCG PO TABS: ORAL_TABLET | ORAL | Status: DC

## 2012-03-29 MED ORDER — DOXYCYCLINE HYCLATE 100 MG PO TABS: 100.0000 mg | ORAL_TABLET | Freq: Two times a day (BID) | ORAL | Status: DC

## 2012-03-29 NOTE — Telephone Encounter (Signed)
Discuss with patient, Rx sent. 

## 2012-03-29 NOTE — Assessment & Plan Note (Signed)
New.  Suspect this is multifactorial w/ some musculoskeletal discomfort from recent wood cutting but also possible GERD/lower esophageal ring/H pylori.  Check labs to r/o pancreatitis, biliary abnormality, infxn, H pylori.  Start PPI.  Next steps pending lab results.  Reviewed supportive care and red flags that should prompt return.  Pt expressed understanding and is in agreement w/ plan.

## 2012-03-29 NOTE — Telephone Encounter (Signed)
Message copied by Verdene Rio on Thu Mar 29, 2012  5:10 PM ------      Message from: Sheliah Hatch      Created: Thu Mar 29, 2012  4:16 PM       H pylori +.  Needs to start bismuth subsalicylate 525mg  TID, flagyl 500mg  TID, doxycycline 100mg  BID x2 weeks.  Increase omeprazole 20mg  to BID.  This is likely the cause of his abdominal pain.

## 2012-03-29 NOTE — Patient Instructions (Addendum)
We'll notify you of your lab results Start the Omeprazole daily for 2 weeks for the reflux/heartburn- and then as needed Tylenol for muscular pain If no improvement in the next few weeks or worsening, please call and we'll refer you to GI Hang in there!

## 2012-03-29 NOTE — Progress Notes (Signed)
  Subjective:    Patient ID: Gregory Webb, male    DOB: 06/27/1960, 52 y.o.   MRN: [id number]  HPI Abdominal pain- epigastric pain.  Started Monday night.  Cut wood on Monday afternoon.  Pain feels deeper than muscular.  No radiation of pain.  Some soreness of bilateral lower ribs.  Epigastric pain described as a 'pressure'.  No SOB.  No CP.  No fevers.  + GERD- not currently taking anything.  Hx of lower esophageal ring.  Pain is intermittent.  Area is TTP.  No N/V/D.   Review of Systems For ROS see HPI     Objective:   Physical Exam  Constitutional: He is oriented to person, place, and time. He appears well-developed and well-nourished. No distress.  HENT:  Head: Normocephalic and atraumatic.  Eyes: Conjunctivae normal and EOM are normal. Pupils are equal, round, and reactive to light.  Neck: Normal range of motion. Neck supple. No thyromegaly present.  Cardiovascular: Normal rate, regular rhythm, normal heart sounds and intact distal pulses.   No murmur heard. Pulmonary/Chest: Effort normal and breath sounds normal. No respiratory distress.  Abdominal: Soft. Bowel sounds are normal. He exhibits no distension and no mass. There is tenderness (mild epigastric TTP). There is no rebound and no guarding.  Musculoskeletal: He exhibits no edema.  Lymphadenopathy:    He has no cervical adenopathy.  Neurological: He is alert and oriented to person, place, and time. No cranial nerve deficit.  Skin: Skin is warm and dry.  Psychiatric: He has a normal mood and affect. His behavior is normal.          Assessment & Plan:

## 2012-04-04 ENCOUNTER — Telehealth: Payer: Self-pay | Admitting: Internal Medicine

## 2012-04-04 NOTE — Telephone Encounter (Signed)
Please see phone note from CAN and advise. Thanks

## 2012-04-04 NOTE — Telephone Encounter (Signed)
He can take any of the OTC cough/cold meds to improve his sxs.  There shouldn't be any interactions

## 2012-04-04 NOTE — Telephone Encounter (Signed)
Patient Information:  Caller Name: Mayjor  Phone: [phone]  Patient: Gregory Webb, Gregory Webb  Gender: Male  DOB: Mar 30, 1960  Age: 52 Years  PCP: Sheliah Hatch.  Office Follow Up:  Does the office need to follow up with this patient?: Yes  Instructions For The Office: Was seen in office on 03/29/12. Was diagnosed with H. Pylori. Was ordered Doxycycline, Metronidazole and Omeprazole. Has developed cough and headache, runny nose. Are there any over the counter meds for cold that are contraindicated with medications ordered for H Pylori?   Symptoms  Reason For Call & Symptoms: Olis states he was seen in office on 03/29/12 and diagnosed with a bacterial infection of his stomach. Per EPIC - was positive for H. Pylori. Was ordered Doxycycline and Metronidazole x 2 weeks and Omeprazole. Is on day 6 of meds. Had onset of cough and headache on 04/03/12. Had chills on 04/03/12  but not on  04/04/12. Has no thermometer.  Runny nose with clear discharge onset 04/02/12.  Asking if cough and headache could be related to his stomach infection. Asking if   there are any over the counter  medications for colds  recommended that can be taken with Doxycycline, Metronidazole and Omeprazole. Does MD think his cold symptoms are related to his stomach infection.?.  Reviewed Health History In EMR: Yes  Reviewed Medications In EMR: Yes  Reviewed Allergies In EMR: Yes  Reviewed Surgeries / Procedures: Yes  Date of Onset of Symptoms: 04/03/2012  Treatments Tried: Cough medicine and Ibuprofen  Treatments Tried Worked: No  Guideline(s) Used:  Colds  Disposition Per Guideline:   Home Care  Reason For Disposition Reached:   Colds with no complications  Advice Given:  Reassurance  It sounds like an uncomplicated cold that we can treat at home.  Colds are caused by viruses, and no medicine or "shot" will cure an uncomplicated cold.  For a Runny Nose With Profuse Discharge:   Nasal mucus and discharge helps  to wash viruses and bacteria out of the nose and sinuses.  Blowing the nose is all that is needed.  If the skin around your nostrils gets irritated, apply a tiny amount of petroleum ointment to the nasal openings once or twice a day.  Treatment for Associated Symptoms of Colds:  For muscle aches, headaches, or moderate fever (more than 101 F or 38.9 C): Take acetaminophen every 4 hours.  Cough: Use cough drops.  Hydrate: Drink adequate liquids.  Humidifier:  If the air in your home is dry, use a cool-mist humidifier  Expected Course:   Fever may last 2-3 days  Nasal discharge 7-14 days  Cough up to 2-3 weeks.  Call Back If:  Difficulty breathing occurs  Fever lasts more than 3 days  Nasal discharge lasts more than 10 days  Cough lasts more than 3 weeks  Caution - Dextromethorphan:   Do not try to completely suppress coughs that produce mucus and phlegm. Remember that coughing is helpful in bringing up mucus from the lungs and preventing pneumonia.

## 2012-04-05 NOTE — Telephone Encounter (Signed)
Patient notified

## 2012-06-05 ENCOUNTER — Ambulatory Visit (INDEPENDENT_AMBULATORY_CARE_PROVIDER_SITE_OTHER): Payer: Managed Care, Other (non HMO) | Admitting: Internal Medicine

## 2012-06-05 ENCOUNTER — Encounter: Payer: Self-pay | Admitting: Internal Medicine

## 2012-06-05 VITALS — BP 108/70 | HR 85 | Temp 98.5°F | Resp 14 | Ht 72.08 in | Wt 167.0 lb

## 2012-06-05 DIAGNOSIS — Z0000 Encounter for general adult medical examination without abnormal findings: Secondary | ICD-10-CM

## 2012-06-05 LAB — POTASSIUM: Potassium: 3.4 mEq/L — ABNORMAL LOW (ref 3.5–5.1)

## 2012-06-05 LAB — LIPID PANEL
HDL: 43.6 mg/dL (ref >39.00)
LDL Cholesterol: 125 mg/dL — ABNORMAL HIGH (ref 0–99)
Total CHOL/HDL Ratio: 4
Triglycerides: 57 mg/dL (ref 0.0–149.0)

## 2012-06-05 MED ORDER — IPRATROPIUM-ALBUTEROL 18-103 MCG/ACT IN AERO: 2.0000 | INHALATION_SPRAY | RESPIRATORY_TRACT | Status: DC | PRN

## 2012-06-05 NOTE — Patient Instructions (Addendum)
Preventive Health Care: Exercise at least 30-45 minutes a day,  3-4 days a week.  Eat a low-fat diet with lots of fruits and vegetables, up to 7-9 servings per day. This would eliminate the need for vitamin supplements. Consume less than 40 grams of sugar (preferably ZERO) per day from foods & drinks with High Fructose Corn Sugar as #1,2,3 or # 4 on label. Health Care Power of Attorney & Living Will. Complete these if not in place ; these place you in charge of your health care decisions.  If you activate the  My Chart system; lab & Xray results will be released directly  to you as soon as I review & address these through the computer. If you choose not to sign up for My Chart within 36 hours of labs being drawn; results will be reviewed & interpretation added before being copied & mailed, causing a delay in getting the results to you.If you do not receive that report within 7-10 days ,please call. Additionally you can use this system to gain direct  access to your records  if  out of town or @ an office of a  physician who is not in  the My Chart network.  This improves continuity of care & places you in control of your medical record.  

## 2012-06-05 NOTE — Progress Notes (Signed)
  Subjective:    Patient ID: Gregory Webb, male    DOB: Sep 11, 1960, 52 y.o.   MRN: [id number]  HPI   Gregory Webb is here for a physical; he denies acute issues.     Review of Systems  There has been no change in the dose brand mode of administration of thyroid supplement Constitutional: No significant change in weight; significant fatigue; sleep disorder; change in appetite. Eye: no blurred, double ,loss of vision Cardiovascular: no palpitations; racing; irregularity ENT/GI: no constipation; diarrhea;hoarseness;dysphagia Derm: no change in nails,hair,skin Neuro: no numbness or tingling; tremor Psych:no anxiety; depression; panic attacks Endo: no temperature intolerance to heat ,cold      Objective:   Physical Exam Gen.: Thin but healthy and well-nourished in appearance. Alert, appropriate and cooperative throughout exam. Head: Normocephalic without obvious abnormalities;pattern  alopecia ; beard & moustache. Eyes: No corneal or conjunctival inflammation noted. Pupils equal round reactive to light and accommodation.  Extraocular motion intact. Vision grossly normal with lenses Ears: External  ear exam reveals no significant lesions or deformities. Canals clear .TMs normal. Hearing is grossly normal bilaterally. Nose: External nasal exam reveals no deformity or inflammation. Nasal mucosa are pink and moist. No lesions or exudates noted. Septum minimally dislocated to R  Mouth: Oral mucosa and oropharynx reveal no lesions or exudates. Teeth in good repair. Neck: No deformities, masses, or tenderness noted. Range of motion & Thyroid normal. Lungs: Normal respiratory effort; chest expands symmetrically. Lungs are clear to auscultation without rales, wheezes, or increased work of breathing. Heart: Normal rate and rhythm. Normal S1 and S2. No gallop, click, or rub. S4 w/o murmur. Abdomen: Bowel sounds normal; abdomen soft and nontender. No masses, organomegaly or hernias noted. Genitalia:  Dr Earlene Plater, Urology                              Musculoskeletal/extremities: No deformity or scoliosis noted of  the thoracic or lumbar spine.  No clubbing, cyanosis, edema, or significant extremity  deformity noted. Range of motion normal .Tone & strength  Normal. Joints normal . Nail health good except post traumatic L great nail changes. Able to lie down & sit up w/o help. Negative SLR bilaterally Vascular: Carotid, radial artery, dorsalis pedis and  posterior tibial pulses are full and equal. No bruits present. Neurologic: Alert and oriented x3. Deep tendon reflexes symmetrical and normal.  Skin: Intact without suspicious lesions or rashes. Lymph: No cervical, axillary lymphadenopathy present. Psych: Mood and affect are normal. Normally interactive                                                                                        Assessment & Plan:  #1 comprehensive physical exam; no acute findings  Plan: see Orders  & Recommendations

## 2012-09-11 ENCOUNTER — Other Ambulatory Visit: Payer: Self-pay | Admitting: Family Medicine

## 2013-03-19 ENCOUNTER — Ambulatory Visit (INDEPENDENT_AMBULATORY_CARE_PROVIDER_SITE_OTHER): Payer: Managed Care, Other (non HMO) | Admitting: Family Medicine

## 2013-03-19 ENCOUNTER — Encounter: Payer: Self-pay | Admitting: Family Medicine

## 2013-03-19 VITALS — BP 140/78 | HR 65 | Temp 98.4°F | Resp 16 | Wt 177.2 lb

## 2013-03-19 DIAGNOSIS — K219 Gastro-esophageal reflux disease without esophagitis: Secondary | ICD-10-CM

## 2013-03-19 DIAGNOSIS — R1013 Epigastric pain: Secondary | ICD-10-CM

## 2013-03-19 MED ORDER — PANTOPRAZOLE SODIUM 40 MG PO TBEC: 40.0000 mg | DELAYED_RELEASE_TABLET | Freq: Every day | ORAL | Status: DC

## 2013-03-19 NOTE — Progress Notes (Signed)
Pre visit review using our clinic review tool, if applicable. No additional management support is needed unless otherwise documented below in the visit note. 

## 2013-03-19 NOTE — Progress Notes (Signed)
   Subjective:    Patient ID: CALDWELL KRONENBERGER, male    DOB: 07-Apr-1960, 53 y.o.   MRN: [id number]  HPI Epigastric discomfort- sxs started ~3 weeks ago.  Increased GERD.  No sour brash.  Has sensation of lump in throat when swallowing.  Epigastric pressure is worse prior to eating.  Not currently on GERD medication.  No N/V/D.  Pressure improves somewhat after eating.   Review of Systems For ROS see HPI     Objective:   Physical Exam  Vitals reviewed. Constitutional: He is oriented to person, place, and time. He appears well-developed and well-nourished. No distress.  HENT:  Head: Normocephalic and atraumatic.  Eyes: Conjunctivae and EOM are normal. Pupils are equal, round, and reactive to light.  Neck: Normal range of motion. Neck supple. No thyromegaly present.  Cardiovascular: Normal rate, regular rhythm, normal heart sounds and intact distal pulses.   No murmur heard. Pulmonary/Chest: Effort normal and breath sounds normal. No respiratory distress.  Abdominal: Soft. Bowel sounds are normal. He exhibits no distension.  Musculoskeletal: He exhibits no edema.  Lymphadenopathy:    He has no cervical adenopathy.  Neurological: He is alert and oriented to person, place, and time. No cranial nerve deficit.  Skin: Skin is warm and dry.  Psychiatric: He has a normal mood and affect. His behavior is normal.          Assessment & Plan:

## 2013-03-19 NOTE — Patient Instructions (Signed)
Follow up as needed Start the Protonix daily We'll notify you of your lab results and make any changes if needed Call with any questions or concerns- particularly if no improvement Hang in there!!!

## 2013-03-19 NOTE — Assessment & Plan Note (Signed)
New.  Pt not currently on meds.  Start PPI and monitor for sxs improvement.

## 2013-03-19 NOTE — Assessment & Plan Note (Signed)
Recurrent problem.  Check labs.  Start PPI.  Suspect GERD.  If no improvement, will refer back to GI

## 2013-03-20 ENCOUNTER — Encounter: Payer: Self-pay | Admitting: General Practice

## 2013-03-20 LAB — HEPATIC FUNCTION PANEL
ALK PHOS: 58 U/L (ref 39–117)
ALT: 17 U/L (ref 0–53)
AST: 22 U/L (ref 0–37)
Albumin: 4.1 g/dL (ref 3.5–5.2)
BILIRUBIN DIRECT: 0.1 mg/dL (ref 0.0–0.3)
TOTAL PROTEIN: 6.8 g/dL (ref 6.0–8.3)
Total Bilirubin: 0.9 mg/dL (ref 0.3–1.2)

## 2013-03-20 LAB — H. PYLORI ANTIBODY, IGG: H Pylori IgG: NEGATIVE

## 2013-03-20 LAB — AMYLASE: AMYLASE: 82 U/L (ref 27–131)

## 2013-03-20 LAB — LIPASE: LIPASE: 49 U/L (ref 11.0–59.0)

## 2013-09-02 ENCOUNTER — Other Ambulatory Visit (INDEPENDENT_AMBULATORY_CARE_PROVIDER_SITE_OTHER): Payer: Managed Care, Other (non HMO)

## 2013-09-02 ENCOUNTER — Ambulatory Visit (INDEPENDENT_AMBULATORY_CARE_PROVIDER_SITE_OTHER): Payer: Managed Care, Other (non HMO) | Admitting: Internal Medicine

## 2013-09-02 ENCOUNTER — Encounter: Payer: Self-pay | Admitting: Internal Medicine

## 2013-09-02 VITALS — BP 108/80 | HR 53 | Temp 98.3°F | Wt 175.8 lb

## 2013-09-02 DIAGNOSIS — R1013 Epigastric pain: Secondary | ICD-10-CM

## 2013-09-02 DIAGNOSIS — K222 Esophageal obstruction: Secondary | ICD-10-CM

## 2013-09-02 DIAGNOSIS — G8929 Other chronic pain: Secondary | ICD-10-CM

## 2013-09-02 DIAGNOSIS — Q393 Congenital stenosis and stricture of esophagus: Secondary | ICD-10-CM

## 2013-09-02 DIAGNOSIS — K219 Gastro-esophageal reflux disease without esophagitis: Secondary | ICD-10-CM

## 2013-09-02 DIAGNOSIS — Q391 Atresia of esophagus with tracheo-esophageal fistula: Secondary | ICD-10-CM

## 2013-09-02 LAB — H. PYLORI ANTIBODY, IGG: H Pylori IgG: NEGATIVE

## 2013-09-02 LAB — CBC WITH DIFFERENTIAL/PLATELET
BASOS PCT: 0.6 % (ref 0.0–3.0)
Basophils Absolute: 0 10*3/uL (ref 0.0–0.1)
Eosinophils Absolute: 0.3 10*3/uL (ref 0.0–0.7)
Eosinophils Relative: 3.6 % (ref 0.0–5.0)
HEMATOCRIT: 45.5 % (ref 39.0–52.0)
HEMOGLOBIN: 15.4 g/dL (ref 13.0–17.0)
LYMPHS ABS: 1.8 10*3/uL (ref 0.7–4.0)
Lymphocytes Relative: 23.9 % (ref 12.0–46.0)
MCHC: 34 g/dL (ref 30.0–36.0)
MCV: 88.1 fl (ref 78.0–100.0)
Monocytes Absolute: 0.6 10*3/uL (ref 0.1–1.0)
Monocytes Relative: 8.1 % (ref 3.0–12.0)
Neutro Abs: 4.8 10*3/uL (ref 1.4–7.7)
Neutrophils Relative %: 63.8 % (ref 43.0–77.0)
Platelets: 482 10*3/uL — ABNORMAL HIGH (ref 150.0–400.0)
RBC: 5.16 Mil/uL (ref 4.22–5.81)
RDW: 13.5 % (ref 11.5–15.5)
WBC: 7.6 10*3/uL (ref 4.0–10.5)

## 2013-09-02 LAB — HEPATIC FUNCTION PANEL
ALT: 21 U/L (ref 0–53)
AST: 22 U/L (ref 0–37)
Albumin: 4.5 g/dL (ref 3.5–5.2)
Alkaline Phosphatase: 73 U/L (ref 39–117)
Bilirubin, Direct: 0.1 mg/dL (ref 0.0–0.3)
TOTAL PROTEIN: 7.9 g/dL (ref 6.0–8.3)
Total Bilirubin: 0.9 mg/dL (ref 0.2–1.2)

## 2013-09-02 LAB — AMYLASE: Amylase: 70 U/L (ref 27–131)

## 2013-09-02 LAB — LIPASE: LIPASE: 45 U/L (ref 11.0–59.0)

## 2013-09-02 MED ORDER — DEXLANSOPRAZOLE 60 MG PO CPDR: 60.0000 mg | DELAYED_RELEASE_CAPSULE | Freq: Every day | ORAL | Status: DC

## 2013-09-02 NOTE — Progress Notes (Signed)
Pre visit review using our clinic review tool, if applicable. No additional management support is needed unless otherwise documented below in the visit note. 

## 2013-09-02 NOTE — Progress Notes (Signed)
   Subjective:    Patient ID: Gregory Webb, male    DOB: 01/19/1961, 53 y.o.   MRN: [id number]  HPI  He has had epigastric discomfort; grade 1-2 & dull, intermittently for 3 months. There was no specific trigger. He has stopped Protonix 40 mg daily as he did not feel it was of benefit.  The symptoms are quiescent in the morning but increase through the day. His wife describes halitosis in the mornings. He also has an associated "sore throat" which he describes as a "lump".  He has a past history of GERD with a lower esophageal ring. He's also had no adenomatous polyps. His colonoscopy is due in 1.5 years  Other than dyspepsia he has no other active symptoms.    Review of Systems  Specifically denies dysphagia, anorexia, hematemesis, weight loss, fever, chills, or sweats.  He has no dysuria, pyuria, or hematuria.  There is no back pain that radiates anteriorly  There's been no rash or change in color/temperature of skin in the area of discomfort.     Objective:   Physical Exam He appears thin but adequately nourished. In no distress. Head is shaven.  Eyes: No conjunctival inflammation or scleral icterus is present.  Oral exam: Dental hygiene is good; lips and gums are healthy appearing.There is no oropharyngeal erythema or exudate noted.   Heart:  Normal rate and regular rhythm. S1 and S2 normal without gallop, murmur, or rub. Click vs S4   Lungs:Chest clear to auscultation; no wheezes, rhonchi,rales ,or rubs present.No increased work of breathing.   Abdomen: bowel sounds normal, soft and non-tender without masses, organomegaly or hernias noted.  No guarding or rebound . No tenderness over the flanks to percussion  Musculoskeletal: Able to lie flat and sit up without help. Negative straight leg raising bilaterally. Gait normal  Skin:Warm & dry.  Intact without suspicious lesions or rashes ; no jaundice or tenting  Lymphatic: No lymphadenopathy is noted about the head,  neck, axilla             Assessment & Plan:  #1 epigastric pain daily for approximately 3 months. This is in the context of a lower esophageal ring and GERD. PPI @ adequate dose is not effective  #2 sore throat which may be related to reflux  See orders and recommendations.

## 2013-09-02 NOTE — Patient Instructions (Addendum)
Reflux of gastric acid may be asymptomatic as this may occur mainly during sleep.The triggers for reflux  include stress; the "aspirin family" ; alcohol; peppermint; and caffeine (coffee, tea, cola, and chocolate). The aspirin family would include aspirin and the nonsteroidal agents such as ibuprofen &  Naproxen. Tylenol would not cause reflux. If having symptoms ; food & drink should be avoided for @ least 2 hours before going to bed.    Take the Dexilant each am before breakfast.   Your next office appointment will be determined based upon review of your pending labs . Those instructions will be transmitted to you through My Chart  OR  by mail;whichever process is your choice to receive results & recommendations . Followup as needed for your acute issue. Please report any significant change in your symptoms.

## 2013-09-03 ENCOUNTER — Encounter: Payer: Self-pay | Admitting: Internal Medicine

## 2013-09-04 ENCOUNTER — Encounter: Payer: Self-pay | Admitting: Internal Medicine

## 2013-09-08 ENCOUNTER — Encounter: Payer: Self-pay | Admitting: Internal Medicine

## 2013-09-09 ENCOUNTER — Telehealth: Payer: Self-pay | Admitting: *Deleted

## 2013-09-09 NOTE — Telephone Encounter (Signed)
Message copied by Hulan Saas on Mon Sep 09, 2013  3:11 PM ------      Message from: Silvano Rusk E      Created: Mon Sep 09, 2013  1:32 PM      Regarding: earlier appt       Please see if I can see him by august      He is actually a follow-up does not need a new slot       ----- Message -----         From: Hendricks Limes, MD         Sent: 09/09/2013   8:59 AM           To: Gatha Mayer, MD            Will you have your staff put him on call in list ? Thanks, Hopp       ------

## 2013-09-09 NOTE — Telephone Encounter (Signed)
Moved OV to 10/09/13 at 2:15 PM. Patient notified.

## 2013-09-20 ENCOUNTER — Encounter: Payer: Self-pay | Admitting: Gastroenterology

## 2013-10-09 ENCOUNTER — Ambulatory Visit: Payer: Managed Care, Other (non HMO) | Admitting: Internal Medicine

## 2013-11-13 ENCOUNTER — Ambulatory Visit: Payer: Managed Care, Other (non HMO) | Admitting: Internal Medicine

## 2014-01-03 ENCOUNTER — Telehealth: Payer: Self-pay | Admitting: Internal Medicine

## 2014-01-03 NOTE — Telephone Encounter (Signed)
Spoke with patient's caregiver and scheduled OV for Acid reflux on 02/11/14 at 9:15 AM.

## 2014-01-08 ENCOUNTER — Other Ambulatory Visit: Payer: Self-pay | Admitting: Family Medicine

## 2014-01-08 NOTE — Telephone Encounter (Signed)
Pt is scheduled for a CPE in November with [facility]opp.

## 2014-01-08 NOTE — Telephone Encounter (Signed)
Refill #[id number]

## 2014-01-22 ENCOUNTER — Encounter: Payer: Self-pay | Admitting: Internal Medicine

## 2014-01-22 ENCOUNTER — Ambulatory Visit (INDEPENDENT_AMBULATORY_CARE_PROVIDER_SITE_OTHER): Payer: Managed Care, Other (non HMO) | Admitting: Internal Medicine

## 2014-01-22 ENCOUNTER — Ambulatory Visit (INDEPENDENT_AMBULATORY_CARE_PROVIDER_SITE_OTHER): Payer: Managed Care, Other (non HMO)

## 2014-01-22 VITALS — BP 116/78 | HR 83 | Temp 98.4°F | Resp 12 | Ht 72.0 in | Wt 178.5 lb

## 2014-01-22 DIAGNOSIS — Z0000 Encounter for general adult medical examination without abnormal findings: Secondary | ICD-10-CM

## 2014-01-22 DIAGNOSIS — E039 Hypothyroidism, unspecified: Secondary | ICD-10-CM

## 2014-01-22 DIAGNOSIS — E785 Hyperlipidemia, unspecified: Secondary | ICD-10-CM

## 2014-01-22 DIAGNOSIS — Z0189 Encounter for other specified special examinations: Secondary | ICD-10-CM

## 2014-01-22 DIAGNOSIS — Z23 Encounter for immunization: Secondary | ICD-10-CM

## 2014-01-22 MED ORDER — IPRATROPIUM-ALBUTEROL 18-103 MCG/ACT IN AERO: 2.0000 | INHALATION_SPRAY | RESPIRATORY_TRACT | Status: DC | PRN

## 2014-01-22 NOTE — Progress Notes (Signed)
Subjective:    Patient ID: Gregory Webb, male    DOB: 10/31/1960, 53 y.o.   MRN: [id number]  HPI  He is here for a physical;acute issues denied   He has not changed the dose or mode of administration of his thyroid medication  He is on no specific diet. 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 He also is in the gym at least once a week. He has no cardio pulmonal symptoms with these activities.  He's had mild elevation of LDL. There's been no advanced cholesterol testing done. There may be premature stroke in his 2 maternal grandparents.  He did have an adenomatous polyp in 2012; he would be due for follow-up in 2017. He also had a lower esophageal ring. He has no active GI symptoms at this time.    Review of Systems Unexplained weight loss, abdominal pain, significant dyspepsia, dysphagia, melena, rectal bleeding, or persistently small caliber stools are denied.  Chest pain, palpitations, tachycardia, exertional dyspnea, paroxysmal nocturnal dyspnea, claudication or edema are absent.         Objective:   Physical Exam Gen.: Healthy and well-nourished in appearance. Alert, appropriate and cooperative throughout exam. Appears younger than stated age  Head: Normocephalic without obvious abnormalities;pattern alopecia .Beard & moustache. Eyes: No corneal or conjunctival inflammation noted. Pupils equal round reactive to light and accommodation. Extraocular motion intact.  Ears: External  ear exam reveals no significant lesions or deformities. Canals clear .TMs normal. Hearing is grossly normal bilaterally. Nose: External nasal exam reveals no deformity or inflammation. Nasal mucosa are pink and moist. No lesions or exudates noted.   Mouth: Oral mucosa and oropharynx reveal no lesions or exudates. Teeth in good repair. Neck: No deformities, masses, or tenderness noted. Range of motion & Thyroid normal. Lungs: Normal respiratory effort; chest expands symmetrically.  Lungs are clear to auscultation without rales, wheezes, or increased work of breathing. Heart: Normal rate and rhythm. Normal S1 and S2. No gallop, click, or rub. No murmur. Abdomen: Bowel sounds normal; abdomen soft and nontender. No masses, organomegaly or hernias noted. Genitalia: as per Urology                               Musculoskeletal/extremities: No deformity or scoliosis noted of  the thoracic or lumbar spine.  No clubbing, cyanosis, edema, or significant extremity  deformity noted. Range of motion normal .Tone & strength normal. Hand joints normal. Crepitus R knee.  Fingernail / toenail health good. Able to lie down & sit up w/o help. Negative SLR bilaterally Vascular: Carotid, radial artery, dorsalis pedis and  posterior tibial pulses are equal. Decreased DPP w/o ischemic changes.No bruits present. Neurologic: Alert and oriented x3. Deep tendon reflexes symmetrical and normal.  Gait normal .     Skin: Intact without suspicious lesions or rashes. Lymph: No cervical, axillary lymphadenopathy present. Psych: Mood and affect are normal. Normally interactive  Assessment & Plan:  #1 comprehensive physical exam; no acute findings  Plan: see Orders  & Recommendations

## 2014-01-22 NOTE — Progress Notes (Signed)
Pre visit review using our clinic review tool, if applicable. No additional management support is needed unless otherwise documented below in the visit note. 

## 2014-01-22 NOTE — Patient Instructions (Signed)
Your next office appointment will be determined based upon review of your pending labs. Those instructions will be transmitted to you through My Chart . 

## 2014-02-11 ENCOUNTER — Ambulatory Visit (INDEPENDENT_AMBULATORY_CARE_PROVIDER_SITE_OTHER): Payer: Managed Care, Other (non HMO) | Admitting: Internal Medicine

## 2014-02-11 ENCOUNTER — Other Ambulatory Visit: Payer: Self-pay

## 2014-02-11 ENCOUNTER — Encounter: Payer: Self-pay | Admitting: Internal Medicine

## 2014-02-11 ENCOUNTER — Other Ambulatory Visit (INDEPENDENT_AMBULATORY_CARE_PROVIDER_SITE_OTHER): Payer: Managed Care, Other (non HMO)

## 2014-02-11 VITALS — BP 120/72 | HR 68 | Ht 72.0 in | Wt 178.1 lb

## 2014-02-11 DIAGNOSIS — Z0000 Encounter for general adult medical examination without abnormal findings: Secondary | ICD-10-CM

## 2014-02-11 DIAGNOSIS — F458 Other somatoform disorders: Secondary | ICD-10-CM

## 2014-02-11 DIAGNOSIS — Z0189 Encounter for other specified special examinations: Secondary | ICD-10-CM

## 2014-02-11 DIAGNOSIS — R0989 Other specified symptoms and signs involving the circulatory and respiratory systems: Secondary | ICD-10-CM

## 2014-02-11 LAB — TSH: TSH: 5.73 u[IU]/mL — ABNORMAL HIGH (ref 0.35–4.50)

## 2014-02-11 NOTE — Progress Notes (Signed)
  Referred by Dr. Linna Darner Subjective:    Patient ID: Gregory Webb, male    DOB: November 06, 1960, 53 y.o.   MRN: [id number]  HPI Patient is a pleasant middle-aged man I know from prior upper GI endoscopy and colonoscopy. These were performed in 2012 and he had a lower esophageal ring that I dilated that fixed his dysphagia problem and we stopped his PPI and he did well. He has an adenomatous colon polyp and will be due for follow-up colonoscopy in 2017.  His current issue was about a month of globus sensation and possible vague dysphagia but it sounds more like globus. That was in the setting of some stressful issues with his 8 year old daughter. He was started on the Exelon he took that for a week and he has not had any problems with globus in a month and he does not have any dysphagia or heartburn.  Wt Readings from Last 3 Encounters:  02/11/14 178 lb 2 oz (80.797 kg)  01/22/14 178 lb 8 oz (80.967 kg)  09/02/13 175 lb 12.8 oz (79.742 kg)   Medications, allergies, past medical history, past surgical history, family history and social history are reviewed and updated in the EMR.  Review of Systems Positive for some allergies all other review of systems are negative or as above.    Objective:   Physical Exam General:  Well-developed, well-nourished and in no acute distress Eyes:  anicteric. ENT:   Mouth and posterior pharynx free of lesions.  Neck:   supple w/o thyromegaly or mass.  Lungs: Clear to auscultation bilaterally. Heart:  S1S2, no rubs, murmurs, gallops. Abdomen:  soft, non-tender, no hepatosplenomegaly, hernia, or mass and BS+.  Lymph:  no cervical or supraclavicular adenopathy. Neuro:  A&O x 3.  Psych:  appropriate mood and  Affect.   Data Reviewed: 2012 egd and colonoscopy     Assessment & Plan:  Globus sensation   This was transient and associated with situational stressors. Will observe and he will call back if this recurs. We discussed signs and symptoms of  dysphagia, weight loss and that he needs to call back for that. Reminded him of 2017 colonoscopy recall.  I appreciate the opportunity to care for this patient.  Gerre Scull, MD

## 2014-02-11 NOTE — Patient Instructions (Signed)
   Glad you are better. If your swallowing symptoms return please call us back. Otherwise will see you in 2017 for a colonoscopy.  I appreciate the opportunity to care for you. Gatha Mayer, MD, [facility]

## 2014-02-12 ENCOUNTER — Other Ambulatory Visit: Payer: Self-pay | Admitting: Internal Medicine

## 2014-02-12 DIAGNOSIS — E039 Hypothyroidism, unspecified: Secondary | ICD-10-CM

## 2014-02-13 LAB — NMR LIPOPROFILE WITH LIPIDS
Cholesterol, Total: 182 mg/dL (ref 100–199)
HDL PARTICLE NUMBER: 27 umol/L — AB (ref >=30.5)
HDL Size: 8.7 nm — ABNORMAL LOW (ref >=9.2)
HDL-C: 45 mg/dL (ref >39)
LDL (calc): 120 mg/dL — ABNORMAL HIGH (ref 0–99)
LDL Particle Number: 1162 nmol/L — ABNORMAL HIGH (ref <1000)
LDL Size: 21.3 nm (ref >=20.8)
LP-IR SCORE: 31 (ref <=45)
Large HDL-P: 4 umol/L — ABNORMAL LOW (ref >=4.8)
Large VLDL-P: <0.8 nmol/L (ref <=2.7)
SMALL LDL PARTICLE NUMBER: 397 nmol/L (ref <=527)
Triglycerides: 84 mg/dL (ref 0–149)
VLDL Size: 35.8 nm (ref <=46.6)

## 2014-03-05 ENCOUNTER — Telehealth: Payer: Self-pay | Admitting: Internal Medicine

## 2014-03-05 DIAGNOSIS — E039 Hypothyroidism, unspecified: Secondary | ICD-10-CM

## 2014-03-05 MED ORDER — LEVOTHYROXINE SODIUM 50 MCG PO TABS: 50.0000 ug | ORAL_TABLET | Freq: Every day | ORAL | Status: DC

## 2014-03-05 NOTE — Telephone Encounter (Signed)
States synthroid was never sent to [facility] in [place].  Is requesting script to be sent. Patient is currently out.

## 2014-03-05 NOTE — Telephone Encounter (Signed)
Done

## 2014-05-06 ENCOUNTER — Ambulatory Visit: Payer: Managed Care, Other (non HMO) | Admitting: Family Medicine

## 2014-05-07 ENCOUNTER — Ambulatory Visit (INDEPENDENT_AMBULATORY_CARE_PROVIDER_SITE_OTHER): Payer: Managed Care, Other (non HMO) | Admitting: Family Medicine

## 2014-05-07 ENCOUNTER — Encounter: Payer: Self-pay | Admitting: Family Medicine

## 2014-05-07 VITALS — BP 126/80 | HR 92 | Temp 97.9°F | Resp 16 | Ht 72.0 in | Wt 183.0 lb

## 2014-05-07 DIAGNOSIS — E038 Other specified hypothyroidism: Secondary | ICD-10-CM

## 2014-05-07 NOTE — Assessment & Plan Note (Signed)
New to provider, ongoing for pt.  Dose was adjusted at last visit.  Pt is due for repeat TSH.  Does complain of fatigue but has a lot of current stressors.  Adjust meds prn.  Will follow.

## 2014-05-07 NOTE — Patient Instructions (Signed)
Schedule your complete physical for anytime after 11/18 [facility] notify you of your lab results and make any changes if needed Call with any questions or concerns- particularly if stress gets overwhelming Hang in there!!!

## 2014-05-07 NOTE — Progress Notes (Signed)
Pre visit review using our clinic review tool, if applicable. No additional management support is needed unless otherwise documented below in the visit note. 

## 2014-05-07 NOTE — Progress Notes (Signed)
   Subjective:    Patient ID: Gregory Webb, male    DOB: 1960/11/11, 54 y.o.   MRN: [id number]  HPI New to establish. Previous MD- Linna Darner.  UTD on CPE, colonoscopy.  Hypothyroid- chronic problem for pt.  Taking meds daily.  Had dose adjusted in December up to 75mcg.  Due for repeat TSH.  + fatigue.  Was fine up until wife's recent health issues.  No changes to bowel or bladder habits.  No changes to skin/hair/nails.   Review of Systems For ROS see HPI     Objective:   Physical Exam  Constitutional: He is oriented to person, place, and time. He appears well-developed and well-nourished. No distress.  HENT:  Head: Normocephalic and atraumatic.  Eyes: Conjunctivae and EOM are normal. Pupils are equal, round, and reactive to light.  Neck: Normal range of motion. Neck supple. No thyromegaly present.  Cardiovascular: Normal rate, regular rhythm, normal heart sounds and intact distal pulses.   No murmur heard. Pulmonary/Chest: Effort normal and breath sounds normal. No respiratory distress.  Musculoskeletal: He exhibits no edema.  Lymphadenopathy:    He has no cervical adenopathy.  Neurological: He is alert and oriented to person, place, and time. No cranial nerve deficit.  Skin: Skin is warm and dry.  Psychiatric: He has a normal mood and affect. His behavior is normal.  Vitals reviewed.         Assessment & Plan:

## 2014-05-08 LAB — TSH: TSH: 4.21 u[IU]/mL (ref 0.35–4.50)

## 2014-05-09 ENCOUNTER — Other Ambulatory Visit: Payer: Self-pay | Admitting: General Practice

## 2014-05-09 MED ORDER — LEVOTHYROXINE SODIUM 75 MCG PO TABS: 75.0000 ug | ORAL_TABLET | Freq: Every day | ORAL | Status: DC

## 2014-08-13 ENCOUNTER — Other Ambulatory Visit: Payer: Self-pay | Admitting: Family Medicine

## 2014-08-13 NOTE — Telephone Encounter (Signed)
Med filled.  

## 2014-08-15 ENCOUNTER — Ambulatory Visit: Payer: Managed Care, Other (non HMO) | Admitting: Family Medicine

## 2015-01-22 ENCOUNTER — Emergency Department (HOSPITAL_BASED_OUTPATIENT_CLINIC_OR_DEPARTMENT_OTHER): Payer: Managed Care, Other (non HMO)

## 2015-01-22 ENCOUNTER — Encounter (HOSPITAL_BASED_OUTPATIENT_CLINIC_OR_DEPARTMENT_OTHER): Payer: Self-pay | Admitting: *Deleted

## 2015-01-22 ENCOUNTER — Emergency Department (HOSPITAL_BASED_OUTPATIENT_CLINIC_OR_DEPARTMENT_OTHER)
Admission: EM | Admit: 2015-01-22 | Discharge: 2015-01-22 | Disposition: A | Discharge status: Home / Self Care | Payer: Managed Care, Other (non HMO) | Attending: Emergency Medicine | Admitting: Emergency Medicine

## 2015-01-22 DIAGNOSIS — Z79899 Other long term (current) drug therapy: Secondary | ICD-10-CM | POA: Insufficient documentation

## 2015-01-22 DIAGNOSIS — Z86010 Personal history of colonic polyps: Secondary | ICD-10-CM | POA: Insufficient documentation

## 2015-01-22 DIAGNOSIS — Z87438 Personal history of other diseases of male genital organs: Secondary | ICD-10-CM | POA: Insufficient documentation

## 2015-01-22 DIAGNOSIS — S29002A Unspecified injury of muscle and tendon of back wall of thorax, initial encounter: Secondary | ICD-10-CM | POA: Diagnosis present

## 2015-01-22 DIAGNOSIS — Q393 Congenital stenosis and stricture of esophagus: Secondary | ICD-10-CM | POA: Diagnosis not present

## 2015-01-22 DIAGNOSIS — Y998 Other external cause status: Secondary | ICD-10-CM | POA: Diagnosis not present

## 2015-01-22 DIAGNOSIS — W11XXXA Fall on and from ladder, initial encounter: Secondary | ICD-10-CM | POA: Insufficient documentation

## 2015-01-22 DIAGNOSIS — E039 Hypothyroidism, unspecified: Secondary | ICD-10-CM | POA: Diagnosis not present

## 2015-01-22 DIAGNOSIS — Z87448 Personal history of other diseases of urinary system: Secondary | ICD-10-CM | POA: Insufficient documentation

## 2015-01-22 DIAGNOSIS — Y9289 Other specified places as the place of occurrence of the external cause: Secondary | ICD-10-CM | POA: Diagnosis not present

## 2015-01-22 DIAGNOSIS — K219 Gastro-esophageal reflux disease without esophagitis: Secondary | ICD-10-CM | POA: Insufficient documentation

## 2015-01-22 DIAGNOSIS — Y9389 Activity, other specified: Secondary | ICD-10-CM | POA: Diagnosis not present

## 2015-01-22 DIAGNOSIS — Z87891 Personal history of nicotine dependence: Secondary | ICD-10-CM | POA: Insufficient documentation

## 2015-01-22 DIAGNOSIS — Z8639 Personal history of other endocrine, nutritional and metabolic disease: Secondary | ICD-10-CM | POA: Insufficient documentation

## 2015-01-22 DIAGNOSIS — J45909 Unspecified asthma, uncomplicated: Secondary | ICD-10-CM | POA: Insufficient documentation

## 2015-01-22 DIAGNOSIS — S22040A Wedge compression fracture of fourth thoracic vertebra, initial encounter for closed fracture: Secondary | ICD-10-CM | POA: Diagnosis not present

## 2015-01-22 DIAGNOSIS — S22000A Wedge compression fracture of unspecified thoracic vertebra, initial encounter for closed fracture: Secondary | ICD-10-CM

## 2015-01-22 DIAGNOSIS — W19XXXA Unspecified fall, initial encounter: Secondary | ICD-10-CM

## 2015-01-22 MED ORDER — OXYCODONE-ACETAMINOPHEN 5-325 MG PO TABS: 2.0000 | ORAL_TABLET | ORAL | Status: DC | PRN

## 2015-01-22 MED ORDER — OXYCODONE-ACETAMINOPHEN 5-325 MG PO TABS
2.0000 | ORAL_TABLET | Freq: Once | ORAL | Status: AC
  Administered 2015-01-22: 2 via ORAL
  Filled 2015-01-22: qty 2

## 2015-01-22 MED ORDER — HYDROMORPHONE HCL 1 MG/ML IJ SOLN
1.0000 mg | Freq: Once | INTRAMUSCULAR | Status: AC
  Administered 2015-01-22: 1 mg via INTRAVENOUS
  Filled 2015-01-22: qty 1

## 2015-01-22 NOTE — ED Notes (Signed)
This afternoon he fell 6' off a ladder landed on his back onto dirt and leaves. C.o pain in his mid back.

## 2015-01-22 NOTE — ED Provider Notes (Signed)
CSN: [id number]     Arrival date & time 01/22/15  1834 History   First MD Initiated Contact with Patient 01/22/15 1837     Chief Complaint  Patient presents with  . Fall    (Consider location/radiation/quality/duration/timing/severity/associated sxs/prior Treatment) Patient is a 54 y.o. male presenting with fall. The history is provided by the patient and the spouse. No language interpreter was used.  Fall Pertinent negatives include no rash.  Mr. Wesby is a 54 year old male with a history of hypothyroidism, asthma, hyperlipidemia, and GERD who presents for thoracic back pain and pain with inspiration after falling 6 feet off a ladder into dirt and leaves just prior to arrival. He denies any head injury or loss of consciousness. He was able to get up and ambulate after the incident. He denies any nausea, vomiting, shortness of breath, neck or head pain, abdominal pain or upper/lower extremity pain.   Past Medical History  Diagnosis Date  . Allergic rhinitis   . Lower esophageal ring 06/2010    dilated  . Hypothyroidism   . Prostatitis     X3; Dr Lawerance Bach  . Low back pain syndrome   . Asthma     prn Combivent   . Hyperlipidemia 08/2009    LDL 128, HDL 49.40,TG 78. No premature CAD/MI in [place]  . GERD (gastroesophageal reflux disease)   . Adenomatous colon polyp   . Diverticulosis   . Hemorrhoids    Past Surgical History  Procedure Laterality Date  . Shoulder surgery  2008    right  . Colonoscopy  06/08/2010    1 adenoma, mild diverticulosis and hemorrhoids plan for repeat 2017 approximately  . Upper gastrointestinal endoscopy  06/08/2010    Lower esophageal ring, dilated to 50 [place]. Mild gastritis, thoracic inlet patch.   Family History  Problem Relation Age of Onset  . Breast cancer Mother   . Hyperlipidemia Mother   . Colon cancer Paternal Grandmother   . Stroke Maternal Grandmother     late 54s  . Heart attack Paternal Grandfather     in 59s  . Diabetes Neg Hx   .  Stroke Maternal Grandfather     late 80s   Social History  Substance Use Topics  . Smoking status: Former Smoker    Types: Cigarettes    Quit date: 06/06/1980  . Smokeless tobacco: None     Comment: smoked [age] , up to 1 pp WEEK  . Alcohol Use: 10.2 oz/week    3 Standard drinks or equivalent, 14 Cans of beer per week    Review of Systems  Respiratory: Negative for shortness of breath.   Musculoskeletal: Positive for back pain.  Skin: Negative for rash and wound.      Allergies  Review of patient's allergies indicates no known allergies.  Home Medications   Prior to Admission medications   Medication Sig Start Date End Date Taking? Authorizing Provider  albuterol-ipratropium (COMBIVENT) 18-103 MCG/ACT inhaler Inhale 2 puffs into the lungs every 4 (four) hours as needed. 01/22/14   Hendricks Limes, MD  levothyroxine (SYNTHROID, LEVOTHROID) 75 MCG tablet Take 1 tablet (75 mcg total) by mouth daily. 05/09/14   Midge Minium, MD  oxyCODONE-acetaminophen (PERCOCET/ROXICET) 5-325 MG tablet Take 2 tablets by mouth every 4 (four) hours as needed for severe pain. 01/22/15   Jamey Demchak Patel-Mills, PA-C  pantoprazole (PROTONIX) 40 MG tablet TAKE 1 TABLET (40 MG TOTAL) BY MOUTH DAILY. 08/13/14   Midge Minium, MD   BP  113/74 mmHg  Pulse 83  Temp(Src) 97.6 F (36.4 C) (Oral)  Resp 20  Ht 6' (1.829 m)  Wt 184 lb (83.462 kg)  BMI 24.95 kg/m2  SpO2 96% Physical Exam  Constitutional: He is oriented to person, place, and time. He appears well-developed and well-nourished. No distress.  HENT:  Head: Normocephalic and atraumatic.  Eyes: Conjunctivae are normal.  Neck: Normal range of motion and full passive range of motion without pain. Neck supple. No spinous process tenderness and no muscular tenderness present. Normal range of motion present.  Full range of motion of his neck without pain. No spinous process tenderness.  Cardiovascular: Normal rate.   Pulmonary/Chest: Effort  normal and breath sounds normal. No respiratory distress. He has no wheezes. He has no rales.  No flail chest. No accessory muscle usage. No decreased breath sounds. No shortness of breath on exam. 97% oxygen on room air.  Abdominal: Soft. He exhibits no distension. There is no tenderness. There is no rebound and no guarding.  Abdomen is soft and nontender.  Musculoskeletal: Normal range of motion.  No reproducible midline or paravertebral cervical, thoracic, or lumbar tenderness.  Neurological: He is alert and oriented to person, place, and time. He has normal strength. No sensory deficit. GCS eye subscore is 4. GCS verbal subscore is 5. GCS motor subscore is 6.  GCS 15. No sensory deficit.  Skin: Skin is warm and dry.  No ecchymosis or erythema.  Psychiatric: He has a normal mood and affect.  Nursing note and vitals reviewed.   ED Course  Procedures (including critical care time) Labs Review Labs Reviewed - No data to display  Imaging Review Dg Chest 2 View  01/22/2015  CLINICAL DATA:  [place] 6' off a ladder at 3:30 pm today; pain is in back between scapula and mid sternum; states he landed on his back EXAM: CHEST - 2 VIEW COMPARISON:  None available FINDINGS: Lungs are clear. Heart size and mediastinal contours are within normal limits. No effusion.  No pneumothorax. Visualized skeletal structures are unremarkable. IMPRESSION: No acute cardiopulmonary disease. Electronically Signed   By: Lucrezia Europe M.D.   On: 01/22/2015 19:55   Dg Thoracic Spine 4v  01/22/2015  CLINICAL DATA:  [place] 6' off a ladder at 3:30 pm today; pain is in back between scapula and mid sternum; states he landed on his back EXAM: THORACIC SPINE - 4+ VIEW COMPARISON:  None. FINDINGS: Mild compression fracture deformity of an upper thoracic vertebral body approximately T5 is suspected on the lateral radiograph, although not convincingly evident on the frontal radiograph. The posterior elements are incompletely evaluated.  There is no significant kyphotic deformity. IMPRESSION: 1. Possible mild compression fracture deformity of upper thoracic vertebral body. CT thoracic spine without contrast may be useful for further characterization if clinically relevant. Electronically Signed   By: Lucrezia Europe M.D.   On: 01/22/2015 19:59   Ct Thoracic Spine Wo Contrast  01/22/2015  CLINICAL DATA:  54 year old male with acute mid back pain following fall off ladder today. Initial encounter. EXAM: CT THORACIC SPINE WITHOUT CONTRAST TECHNIQUE: Multidetector CT imaging of the thoracic spine was performed without intravenous contrast administration. Multiplanar CT image reconstructions were also generated. COMPARISON:  01/22/2015 radiograph FINDINGS: Normal alignment noted. There are acute superior endplate compression fractures of T4 and T5-approximately 10-15%. There may be minimal acute superior endplate compression fractures of T3 and T6 as well. There is no evidence of bony retropulsion. The visualize lungs are unremarkable except for minimal  basilar atelectasis. No other significant abnormalities are identified. IMPRESSION: 10-15% acute superior endplate compression fractures of T4 and T5. Possible minimal acute superior endplate compression fractures of T3 in T6 as well. No evidence of subluxation or bony retropulsion. Electronically Signed   By: Margarette Canada M.D.   On: 01/22/2015 21:33   I have personally reviewed and evaluated these image results as part of my medical decision-making.   EKG Interpretation None      MDM   Final diagnoses:  Fall  Thoracic compression fracture, closed, initial encounter [facility])  Patient presents for back pain and pain with inspiration after fall off a ladder, approximately 6 feet above the ground onto dirt and leads. He has no sensory or motor deficit on exam. He has no reproducible pain. X-ray of the thoracic spine shows possible mild compression fracture at T5 but recommended CT thoracic spine  without contrast. Chest x-ray is without fracture or pneumothorax. I spoke to Dr. Charmian Muff with neurosurgery regarding the patient's x-ray findings. He recommended that the patient have a CT of his back but that it could be done outpatient depending on what the patient requested.  Medications  HYDROmorphone (DILAUDID) injection 1 mg (1 mg Intravenous Given 01/22/15 1910)  oxyCODONE-acetaminophen (PERCOCET/ROXICET) 5-325 MG per tablet 2 tablet (2 tablets Oral Given 01/22/15 2059)   CT thoracic spine shows 10-15% acute superior endplate compression fracture of T4 and 5. There is a possible minimally acute superior endplate compression fracture at T3 and T6 as well. No evidence of subluxation or bony retropulsion. I discussed findings with the patient as well as follow-up with neurosurgery. I gave him percocet for pain.     Ottie Glazier, PA-C 01/22/15 2159  Leo Grosser, MD 01/22/15 2233

## 2015-01-22 NOTE — Discharge Instructions (Signed)
Spinal Compression Fracture °Follow-up with neurosurgery. Take ibuprofen for pain and Percocet for breakthrough pain. °A spinal compression fracture is a collapse of the bones that form the spine (vertebrae). With this type of fracture, the vertebrae become squashed (compressed) into a wedge shape. Most compression fractures happen in the middle or lower part of the spine. °CAUSES °This condition may be caused by: °· Thinning and loss of density in the bones (osteoporosis). This is the most common cause. °· A fall. °· A car or motorcycle accident. °· Cancer. °· Trauma, such as a heavy, direct hit to the head. °RISK FACTORS °You may be at greater risk for a spinal compression fracture if you: °· Are 50 years old or older. °· Have osteoporosis. °· Have certain types of cancer, including: °¨ Multiple myeloma. °¨ Lymphoma. °¨ Prostate cancer. °¨ Lung cancer. °¨ Breast cancer. °SYMPTOMS °Symptoms of this condition include: °· Severe pain. °· Pain that gets worse over time. °· Pain that is worse when you stand, walk, sit, or bend. °· Sudden pain that is so bad that it is hard for you to move. °· Bending or humping of the spine. °· Gradual loss of height. °· Numbness, tingling, or weakness in the back and legs. °· Trouble walking. °Your symptoms will depend on the cause of the fracture and how quickly it develops. For example, fractures that are caused by osteoporosis can cause few symptoms, no symptoms, or symptoms that develop slowly over time. °DIAGNOSIS °This condition may be diagnosed based on symptoms, medical history, and a physical exam. During the physical exam, your health care provider may tap along the length of your spine to check for tenderness. Tests may be done to confirm the diagnosis. They may include: °· A bone density test to check for osteoporosis. °· Imaging tests, such as a spine X-ray, a CT scan, or MRI. °TREATMENT °Treatment for this condition depends on the cause and severity of the  condition. Some fractures, such as those that are caused by osteoporosis, may heal on their own with supportive care. This may include: °· Pain medicine. °· Rest. °· A back brace. °· Physical therapy exercises. °· Medicine that reduces bone pain. °· Calcium and vitamin D supplements. °Fractures that cause the back to become misshapen, cause nerve pain or weakness, or do not respond to other treatment may be treated with a surgical procedure, such as: °· Vertebroplasty. In this procedure, bone cement is injected into the collapsed vertebrae to stabilize them. °· Balloon kyphoplasty. In this procedure, the collapsed vertebrae are expanded with a balloon and then bone cement is injected into them. °· Spinal fusion. In this procedure, the collapsed vertebrae are connected (fused) to normal vertebrae. °HOME CARE INSTRUCTIONS °General Instructions °· Take medicines only as directed by your health care provider. °· Do not drive or operate heavy machinery while taking pain medicine. °· If directed, apply ice to the injured area: °¨ Put ice in a plastic bag. °¨ Place a towel between your skin and the bag. °¨ Leave the ice on for 30 minutes every two hours at first. Then apply the ice as needed. °· Wear your neck brace or back brace as directed by your health care provider. °· Do not drink alcohol. Alcohol can interfere with your treatment. °· Keep all follow-up visits as directed by your health care provider. This is important. It can help to prevent permanent injury, disability, and long-lasting (chronic) pain. °Activity °· Stay in bed (on bed rest) only as directed by   your health care provider. Being on bed rest for too long can make your condition worse.  Return to your normal activities as directed by your health care provider. Ask what activities are safe for you.  Do exercises to improve motion and strength in your back (physical therapy), as recommended by your health care provider.  Exercise regularly as  directed by your health care provider. SEEK MEDICAL CARE IF:  You have a fever.  You develop a cough that makes your pain worse.  Your pain medicine is not helping.  Your pain does not get better over time.  You cannot return to your normal activities as planned or expected. SEEK IMMEDIATE MEDICAL CARE IF:  Your pain is very bad and it suddenly gets worse.  You are unable to move any body part (paralysis) that is below the level of your injury.  You have numbness, tingling, or weakness in any body part that is below the level of your injury.  You cannot control your bladder or bowels.   This information is not intended to replace advice given to you by your health care provider. Make sure you discuss any questions you have with your health care provider.   Document Released: 02/21/2005 Document Revised: 07/08/2014 Document Reviewed: 02/25/2014 Elsevier Interactive Patient Education [employer].

## 2015-01-23 ENCOUNTER — Telehealth: Payer: Self-pay | Admitting: Family Medicine

## 2015-01-23 MED ORDER — OXYCODONE-ACETAMINOPHEN 10-325 MG PO TABS: 1.0000 | ORAL_TABLET | Freq: Four times a day (QID) | ORAL | Status: DC | PRN

## 2015-01-23 NOTE — Telephone Encounter (Signed)
Caller name: Mrs. Arcari  Relation to [id number]  Call back number:[phone]   Reason for call:  Spouse called wanting to schedule an ED follow up with PCP, patient was seen in the ED 01/22/15 pain medication was prescribed and states patient will run out of medication by Monday. Please advise

## 2015-01-23 NOTE — Telephone Encounter (Signed)
Please advise, ED note says that pt should have a CT and follow up with Neuro. Does he need to see ortho?

## 2015-01-23 NOTE — Telephone Encounter (Signed)
Pt had CT scan done (available in imaging results) and now needs to see NEUROSURGERY for evaluation and tx.  There is nothing for me to do except write him for more pain meds until his appt (which I am willing to do)

## 2015-01-23 NOTE — Telephone Encounter (Signed)
Med filled and placed at the front desk and pt wife informed.

## 2015-01-23 NOTE — Telephone Encounter (Signed)
Pt wife states that the percocet does not touch the pain, due to him being on so much of it when he previously had a shoulder surgery. Pt wife also advised that he was told neurosurgery could not get him an actual appointment until after thanksgiving (if providers have time they will try to work him in sooner). Please advise on the medication and dose.

## 2015-01-23 NOTE — Telephone Encounter (Signed)
There is not much to offer that is stronger than oxycodone.  We can increase to 10/325 1-2 tabs every 6 hrs as needed for pain, #60, no refills

## 2015-01-26 ENCOUNTER — Encounter: Payer: Managed Care, Other (non HMO) | Admitting: Family Medicine

## 2015-01-30 ENCOUNTER — Emergency Department (HOSPITAL_BASED_OUTPATIENT_CLINIC_OR_DEPARTMENT_OTHER): Payer: Managed Care, Other (non HMO)

## 2015-01-30 ENCOUNTER — Encounter (HOSPITAL_BASED_OUTPATIENT_CLINIC_OR_DEPARTMENT_OTHER): Payer: Self-pay | Admitting: *Deleted

## 2015-01-30 ENCOUNTER — Emergency Department (HOSPITAL_BASED_OUTPATIENT_CLINIC_OR_DEPARTMENT_OTHER)
Admission: EM | Admit: 2015-01-30 | Discharge: 2015-01-30 | Disposition: A | Discharge status: Home / Self Care | Payer: Managed Care, Other (non HMO) | Attending: Emergency Medicine | Admitting: Emergency Medicine

## 2015-01-30 DIAGNOSIS — Z87438 Personal history of other diseases of male genital organs: Secondary | ICD-10-CM | POA: Insufficient documentation

## 2015-01-30 DIAGNOSIS — Z87891 Personal history of nicotine dependence: Secondary | ICD-10-CM | POA: Insufficient documentation

## 2015-01-30 DIAGNOSIS — Z86010 Personal history of colonic polyps: Secondary | ICD-10-CM | POA: Diagnosis not present

## 2015-01-30 DIAGNOSIS — E039 Hypothyroidism, unspecified: Secondary | ICD-10-CM | POA: Diagnosis not present

## 2015-01-30 DIAGNOSIS — R1031 Right lower quadrant pain: Secondary | ICD-10-CM | POA: Insufficient documentation

## 2015-01-30 DIAGNOSIS — K219 Gastro-esophageal reflux disease without esophagitis: Secondary | ICD-10-CM | POA: Insufficient documentation

## 2015-01-30 DIAGNOSIS — R109 Unspecified abdominal pain: Secondary | ICD-10-CM | POA: Diagnosis present

## 2015-01-30 DIAGNOSIS — Z79899 Other long term (current) drug therapy: Secondary | ICD-10-CM | POA: Diagnosis not present

## 2015-01-30 DIAGNOSIS — Q393 Congenital stenosis and stricture of esophagus: Secondary | ICD-10-CM | POA: Diagnosis not present

## 2015-01-30 DIAGNOSIS — J45909 Unspecified asthma, uncomplicated: Secondary | ICD-10-CM | POA: Insufficient documentation

## 2015-01-30 LAB — BASIC METABOLIC PANEL
ANION GAP: 5 (ref 5–15)
BUN: 21 mg/dL — ABNORMAL HIGH (ref 6–20)
CALCIUM: 9.5 mg/dL (ref 8.9–10.3)
CO2: 33 mmol/L — ABNORMAL HIGH (ref 22–32)
Chloride: 102 mmol/L (ref 101–111)
Creatinine, Ser: 0.92 mg/dL (ref 0.61–1.24)
GLUCOSE: 97 mg/dL (ref 65–99)
Potassium: 4.3 mmol/L (ref 3.5–5.1)
Sodium: 140 mmol/L (ref 135–145)

## 2015-01-30 LAB — CBC WITH DIFFERENTIAL/PLATELET
BASOS PCT: 1 %
Basophils Absolute: 0 10*3/uL (ref 0.0–0.1)
EOS ABS: 0.2 10*3/uL (ref 0.0–0.7)
EOS PCT: 4 %
HCT: 42.7 % (ref 39.0–52.0)
Hemoglobin: 14.2 g/dL (ref 13.0–17.0)
LYMPHS ABS: 1 10*3/uL (ref 0.7–4.0)
Lymphocytes Relative: 19 %
MCH: 29.2 pg (ref 26.0–34.0)
MCHC: 33.3 g/dL (ref 30.0–36.0)
MCV: 87.9 fL (ref 78.0–100.0)
Monocytes Absolute: 0.5 10*3/uL (ref 0.1–1.0)
Monocytes Relative: 10 %
Neutro Abs: 3.4 10*3/uL (ref 1.7–7.7)
Neutrophils Relative %: 66 %
PLATELETS: 453 10*3/uL — AB (ref 150–400)
RBC: 4.86 MIL/uL (ref 4.22–5.81)
RDW: 13.1 % (ref 11.5–15.5)
WBC: 5.1 10*3/uL (ref 4.0–10.5)

## 2015-01-30 LAB — URINALYSIS, ROUTINE W REFLEX MICROSCOPIC
BILIRUBIN URINE: NEGATIVE
Glucose, UA: NEGATIVE mg/dL
HGB URINE DIPSTICK: NEGATIVE
KETONES UR: NEGATIVE mg/dL
Leukocytes, UA: NEGATIVE
Nitrite: NEGATIVE
Protein, ur: NEGATIVE mg/dL
SPECIFIC GRAVITY, URINE: 1.009 (ref 1.005–1.030)
pH: 5.5 (ref 5.0–8.0)

## 2015-01-30 MED ORDER — IOHEXOL 300 MG/ML  SOLN
25.0000 mL | Freq: Once | INTRAMUSCULAR | Status: AC | PRN
  Administered 2015-01-30: 25 mL via ORAL

## 2015-01-30 MED ORDER — MORPHINE SULFATE (PF) 4 MG/ML IV SOLN
4.0000 mg | Freq: Once | INTRAVENOUS | Status: AC
  Administered 2015-01-30: 4 mg via INTRAVENOUS
  Filled 2015-01-30: qty 1

## 2015-01-30 MED ORDER — IOHEXOL 300 MG/ML  SOLN
90.0000 mL | Freq: Once | INTRAMUSCULAR | Status: AC | PRN
  Administered 2015-01-30: 100 mL via INTRAVENOUS

## 2015-01-30 MED ORDER — ONDANSETRON HCL 4 MG/2ML IJ SOLN
4.0000 mg | Freq: Once | INTRAMUSCULAR | Status: AC
  Administered 2015-01-30: 4 mg via INTRAVENOUS
  Filled 2015-01-30: qty 2

## 2015-01-30 MED ORDER — SODIUM CHLORIDE 0.9 % IV BOLUS (SEPSIS)
1000.0000 mL | Freq: Once | INTRAVENOUS | Status: AC
  Administered 2015-01-30: 1000 mL via INTRAVENOUS

## 2015-01-30 NOTE — ED Notes (Signed)
MD at bedside. 

## 2015-01-30 NOTE — ED Notes (Signed)
D/c home with ride. Ambulatory with steady gait

## 2015-01-30 NOTE — Discharge Instructions (Signed)
Take 4 over the counter ibuprofen tablets 3 times a day or 2 over-the-counter naproxen tablets twice a day for pain.  Flank Pain Flank pain refers to pain that is located on the side of the body between the upper abdomen and the back. The pain may occur over a short period of time (acute) or may be long-term or reoccurring (chronic). It may be mild or severe. Flank pain can be caused by many things. CAUSES  Some of the more common causes of flank pain include:  Muscle strains.   Muscle spasms.   A disease of your spine (vertebral disk disease).   A lung infection (pneumonia).   Fluid around your lungs (pulmonary edema).   A kidney infection.   Kidney stones.   A very painful skin rash caused by the chickenpox virus (shingles).   Gallbladder disease.  [place] care will depend on the cause of your pain. In general,  Rest as directed by your caregiver.  Drink enough fluids to keep your urine clear or pale yellow.  Only take over-the-counter or prescription medicines as directed by your caregiver. Some medicines may help relieve the pain.  Tell your caregiver about any changes in your pain.  Follow up with your caregiver as directed. SEEK IMMEDIATE MEDICAL CARE IF:   Your pain is not controlled with medicine.   You have new or worsening symptoms.  Your pain increases.   You have abdominal pain.   You have shortness of breath.   You have persistent nausea or vomiting.   You have swelling in your abdomen.   You feel faint or pass out.   You have blood in your urine.  You have a fever or persistent symptoms for more than 2-3 days.  You have a fever and your symptoms suddenly get worse. MAKE SURE YOU:   Understand these instructions.  Will watch your condition.  Will get help right away if you are not doing well or get worse.   This information is not intended to replace advice given to you by your health care provider.  Make sure you discuss any questions you have with your health care provider.   Document Released: 04/14/2005 Document Revised: 11/16/2011 Document Reviewed: 10/06/2011 Elsevier Interactive Patient Education [employer].

## 2015-01-30 NOTE — ED Provider Notes (Signed)
CSN: [id number]     Arrival date & time 01/30/15  0945 History   First MD Initiated Contact with Patient 01/30/15 1021     Chief Complaint  Patient presents with  . Flank Pain     (Consider location/radiation/quality/duration/timing/severity/associated sxs/prior Treatment) Patient is a 54 y.o. male presenting with flank pain. The history is provided by the patient.  Flank Pain This is a new problem. The current episode started more than 2 days ago. The problem occurs constantly. The problem has not changed since onset.Pertinent negatives include no chest pain, no abdominal pain, no headaches and no shortness of breath. Nothing aggravates the symptoms. Nothing relieves the symptoms. He has tried nothing for the symptoms. The treatment provided no relief.   54 yo M with a chief complaints of right flank pain. This been going on for about a week. Patient fell from a ladder couple weeks ago was diagnosed with a T3-T6 fracture which are stable. Patient denies any radiation of the pain nothing seems to make this better or worse patient denies any fevers chills nausea vomiting. Patient denies any diarrhea. Patient denies any new injury. Able to tolerate by mouth without difficulty.  Past Medical History  Diagnosis Date  . Allergic rhinitis   . Lower esophageal ring 06/2010    dilated  . Hypothyroidism   . Prostatitis     X3; Dr Lawerance Bach  . Low back pain syndrome   . Asthma     prn Combivent   . Hyperlipidemia 08/2009    LDL 128, HDL 49.40,TG 78. No premature CAD/MI in [place]  . GERD (gastroesophageal reflux disease)   . Adenomatous colon polyp   . Diverticulosis   . Hemorrhoids    Past Surgical History  Procedure Laterality Date  . Shoulder surgery  2008    right  . Colonoscopy  06/08/2010    1 adenoma, mild diverticulosis and hemorrhoids plan for repeat 2017 approximately  . Upper gastrointestinal endoscopy  06/08/2010    Lower esophageal ring, dilated to 50 [place]. Mild gastritis,  thoracic inlet patch.   Family History  Problem Relation Age of Onset  . Breast cancer Mother   . Hyperlipidemia Mother   . Colon cancer Paternal Grandmother   . Stroke Maternal Grandmother     late 23s  . Heart attack Paternal Grandfather     in 6s  . Diabetes Neg Hx   . Stroke Maternal Grandfather     late 58s   Social History  Substance Use Topics  . Smoking status: Former Smoker    Types: Cigarettes    Quit date: 06/06/1980  . Smokeless tobacco: None     Comment: smoked 18-20 , up to 1 pp WEEK  . Alcohol Use: 10.2 oz/week    3 Standard drinks or equivalent, 14 Cans of beer per week    Review of Systems  Constitutional: Negative for fever and chills.  HENT: Negative for congestion and facial swelling.   Eyes: Negative for discharge and visual disturbance.  Respiratory: Negative for shortness of breath.   Cardiovascular: Negative for chest pain and palpitations.  Gastrointestinal: Negative for vomiting, abdominal pain and diarrhea.  Genitourinary: Positive for flank pain.  Musculoskeletal: Negative for myalgias and arthralgias.  Skin: Negative for color change and rash.  Neurological: Negative for tremors, syncope and headaches.  Psychiatric/Behavioral: Negative for confusion and dysphoric mood.      Allergies  Review of patient's allergies indicates no known allergies.  Home Medications   Prior to  Admission medications   Medication Sig Start Date End Date Taking? Authorizing Provider  albuterol-ipratropium (COMBIVENT) 18-103 MCG/ACT inhaler Inhale 2 puffs into the lungs every 4 (four) hours as needed. 01/22/14  Yes Hendricks Limes, MD  levothyroxine (SYNTHROID, LEVOTHROID) 75 MCG tablet Take 1 tablet (75 mcg total) by mouth daily. 05/09/14  Yes Midge Minium, MD  oxyCODONE-acetaminophen (PERCOCET) 10-325 MG tablet Take 1 tablet by mouth every 6 (six) hours as needed for pain. 01/23/15  Yes Midge Minium, MD  pantoprazole (PROTONIX) 40 MG tablet TAKE 1  TABLET (40 MG TOTAL) BY MOUTH DAILY. 08/13/14  Yes Midge Minium, MD   BP 125/83 mmHg  Pulse 60  Temp(Src) 98.5 F (36.9 C) (Oral)  Resp 16  Ht 6' (1.829 m)  Wt 184 lb (83.462 kg)  BMI 24.95 kg/m2  SpO2 95% Physical Exam  Constitutional: He is oriented to person, place, and time. He appears well-developed and well-nourished.  HENT:  Head: Normocephalic and atraumatic.  Eyes: EOM are normal. Pupils are equal, round, and reactive to light.  Neck: Normal range of motion. Neck supple. No JVD present.  Cardiovascular: Normal rate and regular rhythm.  Exam reveals no gallop and no friction rub.   No murmur heard. Pulmonary/Chest: No respiratory distress. He has no wheezes.  Abdominal: He exhibits no distension. There is tenderness (worsened the right lower quadrant negative Murphy's negative, negative obturator). There is no rebound and no guarding.  Musculoskeletal: Normal range of motion. He exhibits no tenderness.  Neurological: He is alert and oriented to person, place, and time.  Skin: No rash noted. No pallor.  Psychiatric: He has a normal mood and affect. His behavior is normal.  Nursing note and vitals reviewed.   ED Course  Procedures (including critical care time) Labs Review Labs Reviewed  CBC WITH DIFFERENTIAL/PLATELET - Abnormal; Notable for the following:    Platelets 453 (*)    All other components within normal limits  BASIC METABOLIC PANEL - Abnormal; Notable for the following:    CO2 33 (*)    BUN 21 (*)    All other components within normal limits  URINALYSIS, ROUTINE W REFLEX MICROSCOPIC (NOT AT [facility])    Imaging Review Ct Abdomen Pelvis W Contrast  01/30/2015  CLINICAL DATA:  Fall from ladder 1 week ago. T3 through T6 fractures. New onset of right flank pain beginning 2 days ago. The patient has been nauseated. EXAM: CT ABDOMEN AND PELVIS WITH CONTRAST TECHNIQUE: Multidetector CT imaging of the abdomen and pelvis was performed using the standard protocol  following bolus administration of intravenous contrast. CONTRAST:  5mL OMNIPAQUE IOHEXOL 300 MG/ML SOLN, 170mL OMNIPAQUE IOHEXOL 300 MG/ML SOLN COMPARISON:  CT of the abdomen and pelvis 08/19/2004. FINDINGS: Lower chest: Areas of dependent atelectasis are present at the lung bases bilaterally. There is no significant consolidation. The heart size is normal. There is no significant pleural or pericardial effusion. Hepatobiliary: The liver is within normal limits. The common bile duct and gallbladder are unremarkable. Pancreas: Within normal limits. Spleen: Negative Adrenals/Urinary Tract: The adrenal glands are normal bilaterally. A benign exophytic cyst of the right kidney has increased in size, now measuring 3.5 cm. A benign appearing posterior parenchymal cyst in the left kidney is 14 mm. There is slight asymmetry of the ureters, likely within normal limits. The urinary bladder is unremarkable. Stomach/Bowel: The distal esophagus is within normal limits. Stomach and duodenum are unremarkable. Contrast can be followed to the mid small bowel without obstruction. The  distal colon is mostly collapsed. The more proximal colon is within normal limits as well. The appendix is visualized and normal. Vascular/Lymphatic: Minimal atherosclerotic calcifications are present in the aorta without aneurysm. There is no significant adenopathy. Reproductive: The prostate gland is somewhat prominent, measuring 5.8 cm in transverse diameter. Other: No free fluid is present Musculoskeletal: A vacuum disc is present at L5-S1. Vertebral body heights and alignment are maintained. No other focal degenerative changes present. No focal lytic or blastic lesions are present. IMPRESSION: 1. Slight dilation of the right ureter without evidence for obstruction or mass lesion. No significant nephrolithiasis is present. 2. Interval growth of benign right renal cyst. 3. Prominence of the prostate gland as described. 4. Mild degenerative changes in  the lumbar spine. Electronically Signed   By: San Morelle M.D.   On: 01/30/2015 12:18   I have personally reviewed and evaluated these images and lab results as part of my medical decision-making.   EKG Interpretation None      MDM   Final diagnoses:  RLQ abdominal pain    54 yo F with a chief complaint of right flank pain. When patient lays back though he points to his right lower quadrant. CT scan of abdomen and pelvis was negative for appendicitis or other acute pathology. Patient's pain is improved with IV pain medicines. Discussed results with patient. Feel that the pain is most likely muscular skeletal in nature. We'll have him follow-up with his PCP.    I have discussed the diagnosis/risks/treatment options with the patient and family and believe the pt to be eligible for discharge home to follow-up with PCP. We also discussed returning to the ED immediately if new or worsening sx occur. We discussed the sx which are most concerning (e.g., sudden worsening pain, fever, inability to tolerate by mouth) that necessitate immediate return. Medications administered to the patient during their visit and any new prescriptions provided to the patient are listed below.  Medications given during this visit Medications  sodium chloride 0.9 % bolus 1,000 mL (0 mLs Intravenous Stopped 01/30/15 1322)  morphine 4 MG/ML injection 4 mg (4 mg Intravenous Given 01/30/15 1122)  ondansetron (ZOFRAN) injection 4 mg (4 mg Intravenous Given 01/30/15 1120)  iohexol (OMNIPAQUE) 300 MG/ML solution 25 mL (25 mLs Oral Contrast Given 01/30/15 1156)  iohexol (OMNIPAQUE) 300 MG/ML solution 90 mL (100 mLs Intravenous Contrast Given 01/30/15 1157)    Discharge Medication List as of 01/30/2015  1:10 PM      The patient appears reasonably screen and/or stabilized for discharge and I doubt any other medical condition or other [facility] requiring further screening, evaluation, or treatment in the ED at this time  prior to discharge.      Deno Etienne, DO 01/30/15 [phone]

## 2015-01-30 NOTE — ED Notes (Signed)
Patient transported to CT 

## 2015-01-30 NOTE — ED Notes (Signed)
Pt reports fall from ladder 1 week ago and has fractures to T3-T6 which are stable per Neuro. Pt c/o new pain in right flank since Wednesday- denies dysuria and hematuria, c/o +nausea

## 2015-02-02 ENCOUNTER — Ambulatory Visit (INDEPENDENT_AMBULATORY_CARE_PROVIDER_SITE_OTHER): Payer: Managed Care, Other (non HMO) | Admitting: Family Medicine

## 2015-02-02 ENCOUNTER — Encounter: Payer: Self-pay | Admitting: Family Medicine

## 2015-02-02 VITALS — BP 124/82 | HR 76 | Temp 98.3°F | Resp 16 | Ht 72.0 in | Wt 185.2 lb

## 2015-02-02 DIAGNOSIS — Z23 Encounter for immunization: Secondary | ICD-10-CM | POA: Diagnosis not present

## 2015-02-02 DIAGNOSIS — Z0000 Encounter for general adult medical examination without abnormal findings: Secondary | ICD-10-CM | POA: Diagnosis not present

## 2015-02-02 LAB — BASIC METABOLIC PANEL
BUN: 22 mg/dL (ref 6–23)
CALCIUM: 9.6 mg/dL (ref 8.4–10.5)
CO2: 30 meq/L (ref 19–32)
CREATININE: 0.9 mg/dL (ref 0.40–1.50)
Chloride: 103 mEq/L (ref 96–112)
GFR: 93.09 mL/min (ref >60.00)
Glucose, Bld: 90 mg/dL (ref 70–99)
Potassium: 4.3 mEq/L (ref 3.5–5.1)
Sodium: 140 mEq/L (ref 135–145)

## 2015-02-02 LAB — CBC WITH DIFFERENTIAL/PLATELET
BASOS ABS: 0 10*3/uL (ref 0.0–0.1)
BASOS PCT: 0.8 % (ref 0.0–3.0)
EOS ABS: 0.1 10*3/uL (ref 0.0–0.7)
Eosinophils Relative: 3 % (ref 0.0–5.0)
HEMATOCRIT: 42.2 % (ref 39.0–52.0)
HEMOGLOBIN: 13.9 g/dL (ref 13.0–17.0)
LYMPHS PCT: 22.2 % (ref 12.0–46.0)
Lymphs Abs: 1.1 10*3/uL (ref 0.7–4.0)
MCHC: 33 g/dL (ref 30.0–36.0)
MCV: 87.9 fl (ref 78.0–100.0)
MONOS PCT: 7.8 % (ref 3.0–12.0)
Monocytes Absolute: 0.4 10*3/uL (ref 0.1–1.0)
Neutro Abs: 3.3 10*3/uL (ref 1.4–7.7)
Neutrophils Relative %: 66.2 % (ref 43.0–77.0)
Platelets: 559 10*3/uL — ABNORMAL HIGH (ref 150.0–400.0)
RBC: 4.8 Mil/uL (ref 4.22–5.81)
RDW: 13.7 % (ref 11.5–15.5)
WBC: 4.9 10*3/uL (ref 4.0–10.5)

## 2015-02-02 LAB — LIPID PANEL
CHOLESTEROL: 209 mg/dL — AB (ref 0–200)
HDL: 36.5 mg/dL — AB (ref >39.00)
LDL Cholesterol: 146 mg/dL — ABNORMAL HIGH (ref 0–99)
NonHDL: 172.24
Total CHOL/HDL Ratio: 6
Triglycerides: 131 mg/dL (ref 0.0–149.0)
VLDL: 26.2 mg/dL (ref 0.0–40.0)

## 2015-02-02 LAB — HEPATIC FUNCTION PANEL
ALT: 67 U/L — AB (ref 0–53)
AST: 40 U/L — ABNORMAL HIGH (ref 0–37)
Albumin: 4.1 g/dL (ref 3.5–5.2)
Alkaline Phosphatase: 83 U/L (ref 39–117)
BILIRUBIN DIRECT: 0.1 mg/dL (ref 0.0–0.3)
BILIRUBIN TOTAL: 0.7 mg/dL (ref 0.2–1.2)
TOTAL PROTEIN: 6.8 g/dL (ref 6.0–8.3)

## 2015-02-02 LAB — TSH: TSH: 7.49 u[IU]/mL — ABNORMAL HIGH (ref 0.35–4.50)

## 2015-02-02 LAB — PSA: PSA: 5.23 ng/mL — ABNORMAL HIGH (ref 0.10–4.00)

## 2015-02-02 NOTE — Progress Notes (Signed)
Pre visit review using our clinic review tool, if applicable. No additional management support is needed unless otherwise documented below in the visit note. 

## 2015-02-02 NOTE — Patient Instructions (Signed)
Follow up in 1 year or as needed We'll notify you of your lab results and make any changes if needed Keep up the good work!  You look great!!! Call with any questions or concerns If you want to join [place] at the new [place] office, any scheduled appointments will automatically transfer and we will see you at [address] [place] [address] [place], [place] [address] (OPENING 03/10/15) Happy Holidays!!!

## 2015-02-02 NOTE — Progress Notes (Signed)
   Subjective:    Patient ID: Gregory Webb, male    DOB: [birth year], 54 y.o.   MRN: [id number]  HPI CPE- UTD on colonoscopy, due 2017 Carlean Purl)   Review of Systems Patient reports no vision/hearing changes, anorexia, fever ,adenopathy, persistant/recurrent hoarseness, swallowing issues, chest pain, palpitations, edema, persistant/recurrent cough, hemoptysis, dyspnea (rest,exertional, paroxysmal nocturnal), gastrointestinal  bleeding (melena, rectal bleeding), abdominal pain, excessive heart burn, GU symptoms (dysuria, hematuria, voiding/incontinence issues) syncope, focal weakness, memory loss, numbness & tingling, skin/hair/nail changes, depression, anxiety, abnormal bruising/bleeding, musculoskeletal symptoms/signs.     Objective:   Physical Exam BP 124/82 mmHg  Pulse 76  Temp(Src) 98.3 F (36.8 C) (Oral)  Resp 16  Ht 6' (1.829 m)  Wt 185 lb 4 oz (84.029 kg)  BMI 25.12 kg/m2  SpO2 97%  General Appearance:    Alert, cooperative, no distress, appears stated age  Head:    Normocephalic, without obvious abnormality, atraumatic  Eyes:    PERRL, conjunctiva/corneas clear, EOM's intact, fundi    benign, both eyes       Ears:    Normal TM's and external ear canals, both ears  Nose:   Nares normal, septum midline, mucosa normal, no drainage   or sinus tenderness  Throat:   Lips, mucosa, and tongue normal; teeth and gums normal  Neck:   Supple, symmetrical, trachea midline, no adenopathy;       thyroid:  No enlargement/tenderness/nodules  Back:     Symmetric, no curvature, ROM normal, no CVA tenderness  Lungs:     Clear to auscultation bilaterally, respirations unlabored  Chest wall:    No tenderness or deformity  Heart:    Regular rate and rhythm, S1 and S2 normal, no murmur, rub   or gallop  Abdomen:     Soft, non-tender, bowel sounds active all four quadrants,    no masses, no organomegaly  Genitalia:    Deferred to urology  Rectal:    Extremities:   Extremities normal,  atraumatic, no cyanosis or edema  Pulses:   2+ and symmetric all extremities  Skin:   Skin color, texture, turgor normal, no rashes or lesions  Lymph nodes:   Cervical, supraclavicular, and axillary nodes normal  Neurologic:   CNII-XII intact. Normal strength, sensation and reflexes      throughout          Assessment & Plan:

## 2015-02-02 NOTE — Assessment & Plan Note (Signed)
Pt's PE WNL.  Due for recall colonoscopy next year- already scheduled.  Pt plans to call urology and schedule a f/u appt after noted prostate enlargement on CT scan.  Check labs.  Flu shot given.  Check labs.  Anticipatory guidance provided.

## 2015-02-06 ENCOUNTER — Other Ambulatory Visit: Payer: Self-pay | Admitting: Family Medicine

## 2015-02-06 DIAGNOSIS — R7989 Other specified abnormal findings of blood chemistry: Secondary | ICD-10-CM

## 2015-02-06 DIAGNOSIS — E038 Other specified hypothyroidism: Secondary | ICD-10-CM

## 2015-02-06 DIAGNOSIS — R945 Abnormal results of liver function studies: Secondary | ICD-10-CM

## 2015-02-06 MED ORDER — LEVOTHYROXINE SODIUM 100 MCG PO TABS: 100.0000 ug | ORAL_TABLET | Freq: Every day | ORAL | Status: DC

## 2015-03-10 ENCOUNTER — Other Ambulatory Visit (INDEPENDENT_AMBULATORY_CARE_PROVIDER_SITE_OTHER): Payer: Managed Care, Other (non HMO)

## 2015-03-10 ENCOUNTER — Encounter: Payer: Self-pay | Admitting: General Practice

## 2015-03-10 DIAGNOSIS — R945 Abnormal results of liver function studies: Secondary | ICD-10-CM

## 2015-03-10 DIAGNOSIS — E038 Other specified hypothyroidism: Secondary | ICD-10-CM | POA: Diagnosis not present

## 2015-03-10 DIAGNOSIS — R7989 Other specified abnormal findings of blood chemistry: Secondary | ICD-10-CM | POA: Diagnosis not present

## 2015-03-10 LAB — HEPATIC FUNCTION PANEL
ALK PHOS: 62 U/L (ref 39–117)
ALT: 16 U/L (ref 0–53)
AST: 13 U/L (ref 0–37)
Albumin: 4 g/dL (ref 3.5–5.2)
BILIRUBIN DIRECT: 0.1 mg/dL (ref 0.0–0.3)
TOTAL PROTEIN: 6.6 g/dL (ref 6.0–8.3)
Total Bilirubin: 0.7 mg/dL (ref 0.2–1.2)

## 2015-03-10 LAB — TSH: TSH: 2.85 u[IU]/mL (ref 0.35–4.50)

## 2015-03-11 ENCOUNTER — Encounter: Payer: Self-pay | Admitting: Family Medicine

## 2015-04-13 ENCOUNTER — Encounter: Payer: Managed Care, Other (non HMO) | Admitting: Family Medicine

## 2015-08-04 ENCOUNTER — Encounter: Payer: Self-pay | Admitting: Internal Medicine

## 2015-08-19 ENCOUNTER — Encounter: Payer: Self-pay | Admitting: Family Medicine

## 2015-08-19 ENCOUNTER — Ambulatory Visit (INDEPENDENT_AMBULATORY_CARE_PROVIDER_SITE_OTHER): Payer: Managed Care, Other (non HMO) | Admitting: Family Medicine

## 2015-08-19 VITALS — BP 110/72 | HR 69 | Temp 98.1°F | Resp 16 | Ht 72.0 in | Wt 175.1 lb

## 2015-08-19 DIAGNOSIS — S70261A Insect bite (nonvenomous), right hip, initial encounter: Secondary | ICD-10-CM | POA: Diagnosis not present

## 2015-08-19 DIAGNOSIS — S80861A Insect bite (nonvenomous), right lower leg, initial encounter: Secondary | ICD-10-CM | POA: Diagnosis not present

## 2015-08-19 DIAGNOSIS — W57XXXA Bitten or stung by nonvenomous insect and other nonvenomous arthropods, initial encounter: Secondary | ICD-10-CM

## 2015-08-19 NOTE — Progress Notes (Signed)
   Subjective:    Patient ID: Gregory Webb, male    DOB: 15-Feb-1961, 55 y.o.   MRN: [id number]  HPI Tick bites- pt bit 1 week ago on R lower leg superior to medial malleolus and R hip.  Area on lower leg was still firm and red yesterday.  Not really painful, some itching.  No fevers.  No rashes.  No HAs.  Ticks were attached but not engorged.     Review of Systems For ROS see HPI     Objective:   Physical Exam  Constitutional: He is oriented to person, place, and time. He appears well-developed and well-nourished. No distress.  HENT:  Head: Normocephalic and atraumatic.  Neurological: He is alert and oriented to person, place, and time.  Skin: Skin is warm and dry. No rash noted. There is erythema (bite site of R lower leg superior to medial malleolus and R lateral hip are both red but neither are indurated and no signs of fluctuance or pus).  Psychiatric: He has a normal mood and affect. His behavior is normal. Thought content normal.  Vitals reviewed.         Assessment & Plan:

## 2015-08-19 NOTE — Patient Instructions (Signed)
Follow up as needed Thankfully neither area looks infected Continue to clean w/ peroxide and then apply either Neosporin or Hydrocortisone twice daily If the redness spreads, you develop pus, they become painful, or other concerns- please let me know immediately Call with any questions or concerns Have a great summer!!!

## 2015-08-19 NOTE — Progress Notes (Signed)
Pre visit review using our clinic review tool, if applicable. No additional management support is needed unless otherwise documented below in the visit note. 

## 2015-08-19 NOTE — Assessment & Plan Note (Signed)
New.  No evidence of infxn at either bite site.  No systemic sxs to suggest lyme or RMSF- and by pt report, ticks were not engorged.  No need for prophylactic abx at this time.  Reviewed supportive care and red flags that should prompt return.  Pt expressed understanding and is in agreement w/ plan.

## 2015-09-15 ENCOUNTER — Ambulatory Visit (AMBULATORY_SURGERY_CENTER): Payer: Self-pay

## 2015-09-15 VITALS — Ht 72.0 in | Wt 174.0 lb

## 2015-09-15 DIAGNOSIS — Z86010 Personal history of colonic polyps: Secondary | ICD-10-CM

## 2015-09-15 NOTE — Progress Notes (Signed)
No egg or soy allergy.  No previous complications from anesthesia. No home O2. No diet meds. 

## 2015-09-29 ENCOUNTER — Encounter: Payer: Self-pay | Admitting: Internal Medicine

## 2015-09-29 ENCOUNTER — Ambulatory Visit (AMBULATORY_SURGERY_CENTER): Payer: Managed Care, Other (non HMO) | Admitting: Internal Medicine

## 2015-09-29 VITALS — BP 121/73 | HR 53 | Temp 97.3°F | Resp 13 | Ht 72.0 in | Wt 174.0 lb

## 2015-09-29 DIAGNOSIS — Z86010 Personal history of colonic polyps: Secondary | ICD-10-CM | POA: Diagnosis present

## 2015-09-29 HISTORY — PX: COLONOSCOPY WITH PROPOFOL: SHX5780

## 2015-09-29 NOTE — Progress Notes (Signed)
A/ox3 pleased with MAC, report to Penny RN 

## 2015-09-29 NOTE — Op Note (Signed)
[place] Patient Name: Gregory Webb Procedure Date: 09/29/2015 7:55 AM MRN: [id number] Endoscopist: Gatha Mayer , MD Age: 55 Referring MD:  Date of Birth: 12/06/1960 Gender: Male Account #: [id number] Procedure:                Colonoscopy Indications:              Surveillance: Personal history of adenomatous                            polyps on last colonoscopy 5 years ago Medicines:                Propofol per Anesthesia, Monitored Anesthesia Care Procedure:                Pre-Anesthesia Assessment:                           - Prior to the procedure, a History and Physical                            was performed, and patient medications and                            allergies were reviewed. The patient's tolerance of                            previous anesthesia was also reviewed. The risks                            and benefits of the procedure and the sedation                            options and risks were discussed with the patient.                            All questions were answered, and informed consent                            was obtained. Prior Anticoagulants: The patient has                            taken no previous anticoagulant or antiplatelet                            agents. ASA Grade Assessment: II - A patient with                            mild systemic disease. After reviewing the risks                            and benefits, the patient was deemed in                            satisfactory condition to undergo the procedure.  After obtaining informed consent, the colonoscope                            was passed under direct vision. Throughout the                            procedure, the patient's blood pressure, pulse, and                            oxygen saturations were monitored continuously. The                            Model CF-HQ190L [id number]) scope was introduced                             through the anus and advanced to the the cecum,                            identified by appendiceal orifice and ileocecal                            valve. The ileocecal valve, appendiceal orifice,                            and rectum were photographed. The quality of the                            bowel preparation was excellent. The bowel                            preparation used was Miralax. Scope In: 8:11:52 AM Scope Out: 8:29:44 AM Scope Withdrawal Time: 0 hours 12 minutes 5 seconds  Total Procedure Duration: 0 hours 17 minutes 52 seconds  Findings:                 The perianal and digital rectal examinations were                            normal.                           A few large-mouthed diverticula were found in the                            sigmoid colon. There was no evidence of                            diverticular bleeding.                           The exam was otherwise without abnormality on                            direct and retroflexion views. Complications:            No immediate complications. Estimated blood  loss:                            None. Estimated Blood Loss:     Estimated blood loss: none. Impression:               - Mild diverticulosis in the sigmoid colon. There                            was no evidence of diverticular bleeding.                           - The examination was otherwise normal on direct                            and retroflexion views.                           - No specimens collected.                           - Personal history of colonic polyps. Adenoma < 1                            cm 2012 Recommendation:           - Repeat colonoscopy in 10 years for surveillance.                           - Resume previous diet.                           - Continue present medications.                           - Patient has a contact number available for                            emergencies. The signs and symptoms of potential                             delayed complications were discussed with the                            patient. Return to normal activities tomorrow.                            Written discharge instructions were provided to the                            patient. Gatha Mayer, MD 09/29/2015 8:44:11 AM This report has been signed electronically.

## 2015-09-29 NOTE — Patient Instructions (Addendum)
No polyps this time. Next routine colonoscopy in 10 years - 2027  I appreciate the opportunity to care for you. Gatha Mayer, MD, FACG   YOU HAD AN ENDOSCOPIC PROCEDURE TODAY AT [facility]:   Refer to the procedure report that was given to you for any specific questions about what was found during the examination.  If the procedure report does not answer your questions, please call your gastroenterologist to clarify.  If you requested that your care partner not be given the details of your procedure findings, then the procedure report has been included in a sealed envelope for you to review at your convenience later.  YOU SHOULD EXPECT: Some feelings of bloating in the abdomen. Passage of more gas than usual.  Walking can help get rid of the air that was put into your GI tract during the procedure and reduce the bloating. If you had a lower endoscopy (such as a colonoscopy or flexible sigmoidoscopy) you may notice spotting of blood in your stool or on the toilet paper. If you underwent a bowel prep for your procedure, you may not have a normal bowel movement for a few days.  Please Note:  You might notice some irritation and congestion in your nose or some drainage.  This is from the oxygen used during your procedure.  There is no need for concern and it should clear up in a day or so.  SYMPTOMS TO REPORT IMMEDIATELY:   Following lower endoscopy (colonoscopy or flexible sigmoidoscopy):  Excessive amounts of blood in the stool  Significant tenderness or worsening of abdominal pains  Swelling of the abdomen that is new, acute  Fever of 100F or higher    For urgent or emergent issues, a gastroenterologist can be reached at any hour by calling [phone].   DIET: Your first meal following the procedure should be a small meal and then it is ok to progress to your normal diet. Heavy or fried foods are harder to digest and may make you feel nauseous or bloated.   Likewise, meals heavy in dairy and vegetables can increase bloating.  Drink plenty of fluids but you should avoid alcoholic beverages for 24 hours.  ACTIVITY:  You should plan to take it easy for the rest of today and you should NOT DRIVE or use heavy machinery until tomorrow (because of the sedation medicines used during the test).    FOLLOW UP: Our staff will call the number listed on your records the next business day following your procedure to check on you and address any questions or concerns that you may have regarding the information given to you following your procedure. If we do not reach you, we will leave a message.  However, if you are feeling well and you are not experiencing any problems, there is no need to return our call.  We will assume that you have returned to your regular daily activities without incident.     If any biopsies were taken you will be contacted by phone or by letter within the next 1-3 weeks.  Please call us at [phone] if you have not heard about the biopsies in 3 weeks.    SIGNATURES/CONFIDENTIALITY: You and/or your care partner have signed paperwork which will be entered into your electronic medical record.  These signatures attest to the fact that that the information above on your After Visit Summary has been reviewed and is understood.  Full responsibility of the confidentiality of this  discharge information lies with you and/or your care-partner.   Information on diverticulosis given to you today

## 2015-09-30 ENCOUNTER — Telehealth: Payer: Self-pay | Admitting: *Deleted

## 2015-09-30 NOTE — Telephone Encounter (Signed)
  Follow up Call-  Call back number 09/29/2015  Post procedure Call Back phone  # [phone]  Permission to leave phone message Yes  Some recent data might be hidden     Patient questions:  Do you have a fever, pain , or abdominal swelling? No. Pain Score  0 *  Have you tolerated food without any problems? Yes.    Have you been able to return to your normal activities? Yes.    Do you have any questions about your discharge instructions: Diet   No. Medications  No. Follow up visit  No.  Do you have questions or concerns about your Care? No.  Actions: * If pain score is 4 or above: No action needed, pain <4.

## 2015-12-16 DIAGNOSIS — M4724 Other spondylosis with radiculopathy, thoracic region: Secondary | ICD-10-CM | POA: Insufficient documentation

## 2015-12-25 DIAGNOSIS — M79602 Pain in left arm: Secondary | ICD-10-CM | POA: Insufficient documentation

## 2015-12-25 DIAGNOSIS — R29898 Other symptoms and signs involving the musculoskeletal system: Secondary | ICD-10-CM | POA: Insufficient documentation

## 2016-01-01 ENCOUNTER — Ambulatory Visit (INDEPENDENT_AMBULATORY_CARE_PROVIDER_SITE_OTHER): Payer: Managed Care, Other (non HMO) | Admitting: Family Medicine

## 2016-01-01 ENCOUNTER — Encounter: Payer: Self-pay | Admitting: Family Medicine

## 2016-01-01 VITALS — BP 120/80 | HR 65 | Temp 98.2°F | Resp 16 | Ht 72.0 in | Wt 177.1 lb

## 2016-01-01 DIAGNOSIS — K4090 Unilateral inguinal hernia, without obstruction or gangrene, not specified as recurrent: Secondary | ICD-10-CM

## 2016-01-01 NOTE — Progress Notes (Signed)
Pre visit review using our clinic review tool, if applicable. No additional management support is needed unless otherwise documented below in the visit note. 

## 2016-01-01 NOTE — Progress Notes (Signed)
   Subjective:    Patient ID: Gregory Webb, male    DOB: 1961-01-18, 55 y.o.   MRN: [id number]  HPI abd pain- pt reports that he was told a few years ago that he has a hernia.  Pt reports that yesterday it was visible for the first time.  Pain started 4-5 days ago and travels across the entire lower abd.  No pain when lying flat.  No N/V/D.  Pain has been constant if pt is not lying down.  Nothing worsens pain.  abd is not TTP.  Pt prefers to see Gregory Webb (urology) if possible   Review of Systems For ROS see HPI     Objective:   Physical Exam  Constitutional: He is oriented to person, place, and time. He appears well-developed and well-nourished. No distress.  HENT:  Head: Normocephalic and atraumatic.  Abdominal: Soft. Bowel sounds are normal. He exhibits mass (TTP over R inguinal hernia bulge when pt standing, no bulge when lying flat). There is tenderness. There is no rebound and no guarding.  Neurological: He is alert and oriented to person, place, and time.  Skin: Skin is warm and dry.  Psychiatric: He has a normal mood and affect. His behavior is normal. Thought content normal.  Vitals reviewed.         Assessment & Plan:  R inguinal hernia- new.  Pt's pain is due to his current hernia.  Pt prefers Gregory Webb (urology) do his repair if possible.  NSAIDs prn.  Reviewed supportive care and red flags that should prompt return.  Pt expressed understanding and is in agreement w/ plan.

## 2016-01-01 NOTE — Patient Instructions (Signed)
Follow up as needed/scheduled The pain is due to your hernia Ibuprofen/Aleve as needed Try and recline or lie down when it is painful to take the pressure off the area If lying down doesn't improve the pain or the bulge is not able to be pushed back in, this is cause to go to the ER Call with any questions or concerns Hang in there and have a great weekend!

## 2016-02-05 ENCOUNTER — Other Ambulatory Visit: Payer: Self-pay | Admitting: General Practice

## 2016-02-05 ENCOUNTER — Telehealth: Payer: Self-pay | Admitting: Family Medicine

## 2016-02-05 ENCOUNTER — Encounter: Payer: Self-pay | Admitting: Physician Assistant

## 2016-02-05 ENCOUNTER — Ambulatory Visit (INDEPENDENT_AMBULATORY_CARE_PROVIDER_SITE_OTHER): Payer: Managed Care, Other (non HMO) | Admitting: Physician Assistant

## 2016-02-05 VITALS — BP 122/78 | HR 76 | Temp 99.2°F | Resp 14 | Ht 72.0 in | Wt 182.0 lb

## 2016-02-05 DIAGNOSIS — B9789 Other viral agents as the cause of diseases classified elsewhere: Secondary | ICD-10-CM

## 2016-02-05 DIAGNOSIS — J329 Chronic sinusitis, unspecified: Secondary | ICD-10-CM | POA: Diagnosis not present

## 2016-02-05 MED ORDER — IPRATROPIUM-ALBUTEROL 18-103 MCG/ACT IN AERO: 2.0000 | INHALATION_SPRAY | RESPIRATORY_TRACT | 5 refills | Status: DC | PRN

## 2016-02-05 MED ORDER — FLUTICASONE PROPIONATE 50 MCG/ACT NA SUSP: 2.0000 | Freq: Every day | NASAL | 6 refills | Status: DC

## 2016-02-05 MED ORDER — IPRATROPIUM-ALBUTEROL 20-100 MCG/ACT IN AERS: INHALATION_SPRAY | RESPIRATORY_TRACT | 2 refills | Status: DC

## 2016-02-05 MED ORDER — BENZONATATE 100 MG PO CAPS: 100.0000 mg | ORAL_CAPSULE | Freq: Two times a day (BID) | ORAL | 0 refills | Status: DC | PRN

## 2016-02-05 NOTE — Telephone Encounter (Signed)
Please advise on the SIG? The one sent in is 2 puffs into the lungs every 4 hours prn.  Per pt's chart I am not sure where they are getting 1 puff qid

## 2016-02-05 NOTE — Patient Instructions (Signed)
Please stay well hydrated.  Get plenty of rest. Place a humidifier in the bedroom.  Please start saline nasal spray.  Use the Flonase once daily as directed. Use cough medication as directed.  If symptoms are not resolving or anything worsens, please call or come see [place].

## 2016-02-05 NOTE — Telephone Encounter (Signed)
Called back to the pharmacy and reached their voicemail. Left a detailed message to inform that the sig on the new inhaler was correct, 2 puffs every 4 hours as needed.

## 2016-02-05 NOTE — Progress Notes (Signed)
Pre visit review using our clinic review tool, if applicable. No additional management support is needed unless otherwise documented below in the visit note. 

## 2016-02-05 NOTE — Progress Notes (Signed)
Patient presents to clinic today c/o 2 days of nasal congestion, post-nasal drop, sore throat. Denies ear pressure/pain. Notes sinus pressure without sinus pain. Denies fever, chills, SOB or chest pain. Endorses some cough that is sometimes productive of thin sputum. Patient with history of asthma, currently on Combivent. Has not had to use for this episode. Does need refill of medication. Denies recent travel or sick contact. Has taken Ibuprofen and OTC Sinus medication with some relief of symptoms.   Past Medical History:  Diagnosis Date  . Adenomatous colon polyp   . Allergic rhinitis   . Asthma    prn Combivent   . Diverticulosis   . GERD (gastroesophageal reflux disease)   . Hemorrhoids   . Hyperlipidemia 08/2009   LDL 128, HDL 49.40,TG 78. No premature CAD/MI in [place]  . Hypothyroidism   . Low back pain syndrome   . Lower esophageal ring 06/2010   dilated  . Prostatitis    X3; Dr Lawerance Bach    Current Outpatient Prescriptions on File Prior to Visit  Medication Sig Dispense Refill  . albuterol-ipratropium (COMBIVENT) 18-103 MCG/ACT inhaler Inhale 2 puffs into the lungs every 4 (four) hours as needed. 14.7 g 5  . levothyroxine (SYNTHROID, LEVOTHROID) 100 MCG tablet Take 1 tablet (100 mcg total) by mouth daily. 30 tablet 6  . methocarbamol (ROBAXIN) 750 MG tablet      No current facility-administered medications on file prior to visit.     No Known Allergies  Family History  Problem Relation Age of Onset  . Breast cancer Mother   . Hyperlipidemia Mother   . Colon cancer Paternal Grandmother   . Stroke Maternal Grandmother     late 18s  . Heart attack Paternal Grandfather     in 71s  . Stroke Maternal Grandfather     late 64s  . Diabetes Neg Hx     Social History   Social History  . Marital status: Married    Spouse name: N/A  . Number of children: 2  . Years of education: N/A   Occupational History  .  [employer]   Social History Main Topics  . Smoking  status: Former Smoker    Types: Cigarettes    Quit date: 06/06/1980  . Smokeless tobacco: Never Used     Comment: smoked [age] , up to 1 pp WEEK  . Alcohol use 1.8 - 2.4 oz/week    3 - 4 Standard drinks or equivalent per week  . Drug use: No  . Sexual activity: Not Asked   Other Topics Concern  . 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  All other ROS are negative.  BP 122/78   Pulse 76   Temp 99.2 F (37.3 C) (Oral)   Resp 14   Ht 6' (1.829 m)   Wt 182 lb (82.6 kg)   SpO2 97%   BMI 24.68 kg/m   Physical Exam  Constitutional: He is oriented to person, place, and time and well-developed, well-nourished, and in no distress.  HENT:  Head: Normocephalic and atraumatic.  Right Ear: External ear normal.  Left Ear: External ear normal.  Nose: Nose normal.  Mouth/Throat: Oropharynx is clear and moist. No oropharyngeal exudate.  TM within normal  limits bilaterally.  Eyes: Conjunctivae and EOM are normal. Pupils are equal, round, and reactive to light.  Neck: Neck supple. No thyromegaly present.  Cardiovascular: Normal rate, regular rhythm, normal heart sounds and intact distal pulses.   Pulmonary/Chest: Effort normal and breath sounds normal. No respiratory distress. He has no wheezes. He has no rales. He exhibits no tenderness.  Abdominal: Soft. Bowel sounds are normal. He exhibits no distension and no mass. There is no tenderness. There is no rebound and no guarding.  Genitourinary: Testes/scrotum normal and penis normal. No discharge found.  Lymphadenopathy:    He has no cervical adenopathy.  Neurological: He is alert and oriented to person, place, and time.  Skin: Skin is warm and dry. No rash noted.  Psychiatric: Affect normal.  Vitals reviewed.  Assessment/Plan: 1. Viral  sinusitis Rx Flonase and Tessalon. Combivent refilled. Supportive measures reviewed. FU if not resolving.  - fluticasone (FLONASE) 50 MCG/ACT nasal spray; Place 2 sprays into both nostrils daily.  Dispense: 16 g; Refill: 6 - benzonatate (TESSALON) 100 MG capsule; Take 1 capsule (100 mg total) by mouth 2 (two) times daily as needed for cough.  Dispense: 20 capsule; Refill: 0 - albuterol-ipratropium (COMBIVENT) 18-103 MCG/ACT inhaler; Inhale 2 puffs into the lungs every 4 (four) hours as needed.  Dispense: 14.7 g; Refill: 5   Leeanne Rio, [place]

## 2016-02-05 NOTE — Telephone Encounter (Signed)
Correct dose -- 2 puffs into the lungs every 6 hours as needed.

## 2016-02-05 NOTE — Telephone Encounter (Signed)
Calling to get clarification on dosage of albuterol-ipratropium (COMBIVENT) 18-103 MCG/ACT inhaler.  The instructions state Inhale 2 puffs into the lungs every 4 (four) hours as needed.   These are the instructions for the old Combivent inhaler.  The new instructions are 1 puff 4 times a day.  Please return call to pharmacy to clarify instructions.

## 2016-03-07 HISTORY — PX: SHOULDER SURGERY: SHX246

## 2016-03-09 ENCOUNTER — Telehealth: Payer: Self-pay | Admitting: *Deleted

## 2016-03-09 ENCOUNTER — Other Ambulatory Visit: Payer: Self-pay | Admitting: General Practice

## 2016-03-09 MED ORDER — CLONAZEPAM 0.5 MG PO TABS: 0.2500 mg | ORAL_TABLET | ORAL | 0 refills | Status: DC | PRN

## 2016-03-09 NOTE — Telephone Encounter (Signed)
Patient is aware that medication is here in the office. He will pick up after he leaves an appointment.

## 2016-03-09 NOTE — Telephone Encounter (Signed)
Patient called and said that he is leaving about 12:00 to go to the airport to fly for the first time today.  He stated that he is very nervous/anxious and is wondering if there is something with just one tablet that you can give him to help calm him down for his flight.

## 2016-03-09 NOTE — Telephone Encounter (Signed)
Ok for 3 tabs of 0.5mg  clonazepam to take prior to flight- start w/ 1/2 tab since you have never taken before- may cause drowsiness and DO NOT mix w/ alcohol

## 2016-09-01 LAB — PSA: PSA: 7.32

## 2016-09-16 ENCOUNTER — Ambulatory Visit (INDEPENDENT_AMBULATORY_CARE_PROVIDER_SITE_OTHER): Payer: Managed Care, Other (non HMO) | Admitting: Physician Assistant

## 2016-09-16 ENCOUNTER — Encounter: Payer: Self-pay | Admitting: Physician Assistant

## 2016-09-16 VITALS — BP 102/78 | HR 61 | Temp 98.6°F | Resp 14 | Ht 72.0 in | Wt 186.0 lb

## 2016-09-16 DIAGNOSIS — J302 Other seasonal allergic rhinitis: Secondary | ICD-10-CM | POA: Diagnosis not present

## 2016-09-16 DIAGNOSIS — E038 Other specified hypothyroidism: Secondary | ICD-10-CM | POA: Diagnosis not present

## 2016-09-16 DIAGNOSIS — K219 Gastro-esophageal reflux disease without esophagitis: Secondary | ICD-10-CM

## 2016-09-16 LAB — TSH: TSH: 6.13 u[IU]/mL — AB (ref 0.35–4.50)

## 2016-09-16 MED ORDER — FLUTICASONE PROPIONATE 50 MCG/ACT NA SUSP: 2.0000 | Freq: Every day | NASAL | 6 refills | Status: DC

## 2016-09-16 MED ORDER — LEVOTHYROXINE SODIUM 100 MCG PO TABS: 100.0000 ug | ORAL_TABLET | Freq: Every day | ORAL | 6 refills | Status: DC

## 2016-09-16 MED ORDER — OMEPRAZOLE 20 MG PO CPDR: 20.0000 mg | DELAYED_RELEASE_CAPSULE | Freq: Every day | ORAL | 3 refills | Status: DC

## 2016-09-16 NOTE — Patient Instructions (Signed)
Please go to the lab for blood work. We will call with your results.  Please restart the levothyroxine and take daily as directed without fail.  We will need to recheck TSH level in 4-6 weeks.   Please start the Flonase and a daily Claritin for the allergic rhinitis. This is also worsening the reflux. Start the Prilosec daily as directed for 2 weeks. Limit late-night eating. Follow the diet below.  Follow-up with Dr. Birdie Riddle if symptoms are not resolving.

## 2016-09-16 NOTE — Assessment & Plan Note (Signed)
Non-adherent with medication. Will restart levothyroxine 100 mcg daily. TSH level today and repeat level in 4-6 weeks to determine further medication changes.

## 2016-09-16 NOTE — Progress Notes (Signed)
Patient presents to clinic today c/o 2-3 weeks of a slight swollen feeling of throat associated with indigestion and reflux. Patient with history of GERD, not currently on medication. Denies change to voice, abdominal pain, nausea or vomiting. Denies odynophagia but notes intermittent globus sensation. Denies change to bowel habits, melena, hematochezia. Denies recent change to diet but states he eats poorly from time to time and does sometimes eat late at night. Denies any increased alcohol consumption. Patient has not taken anything for symptoms.   Patient with history of hypothyroidism, currently prescribed levothyroxine 100 mcg daily. Has not taken medication in about 8 months. Endorses decreased energy levels.  Denies constipation, AMS, excessively dry skin.   Past Medical History:  Diagnosis Date  . Adenomatous colon polyp   . Allergic rhinitis   . Asthma    prn Combivent   . Diverticulosis   . GERD (gastroesophageal reflux disease)   . Hemorrhoids   . Hyperlipidemia 08/2009   LDL 128, HDL 49.40,TG 78. No premature CAD/MI in [place]  . Hypothyroidism   . Low back pain syndrome   . Lower esophageal ring 06/2010   dilated  . Prostatitis    X3; Dr Lawerance Bach    Current Outpatient Prescriptions on File Prior to Visit  Medication Sig Dispense Refill  . clonazePAM (KLONOPIN) 0.5 MG tablet Take 0.5 tablets (0.25 mg total) by mouth as needed for anxiety. 3 tablet 0  . Ipratropium-Albuterol (COMBIVENT) 20-100 MCG/ACT AERS respimat Please use 1 puff every 4-6 hours as needed. 1 Inhaler 2  . methocarbamol (ROBAXIN) 750 MG tablet      No current facility-administered medications on file prior to visit.     No Known Allergies  Family History  Problem Relation Age of Onset  . Breast cancer Mother   . Hyperlipidemia Mother   . Colon cancer Paternal Grandmother   . Stroke Maternal Grandmother        late 66s  . Heart attack Paternal Grandfather        in 42s  . Stroke Maternal  Grandfather        late 40s  . Diabetes Neg Hx     Social History   Social History  . Marital status: Married    Spouse name: N/A  . Number of children: 2  . Years of education: N/A   Occupational History  .  [employer]   Social History Main Topics  . Smoking status: Former Smoker    Types: Cigarettes    Quit date: 06/06/1980  . Smokeless tobacco: Never Used     Comment: smoked [age] , up to 1 pp WEEK  . Alcohol use 1.8 - 2.4 oz/week    3 - 4 Standard drinks or equivalent per week  . Drug use: No  . Sexual activity: Not Asked   Other Topics Concern  . 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  All other ROS are negative.  BP 102/78   Pulse 61   Temp 98.6 F (37 C) (Oral)   Resp 14   Ht 6' (1.829 m)   Wt 186 lb (84.4 kg)   SpO2 98%   BMI 25.23 kg/m   Physical Exam  Constitutional: He is oriented to person, place, and  time and well-developed, well-nourished, and in no distress.  HENT:  Head: Normocephalic.  Eyes: Conjunctivae are normal.  Neck: Neck supple.  Cardiovascular: Normal rate, regular rhythm, normal heart sounds and intact distal pulses.   Pulmonary/Chest: Effort normal and breath sounds normal. No respiratory distress. He has no wheezes. He has no rales. He exhibits no tenderness.  Neurological: He is alert and oriented to person, place, and time.  Skin: Skin is warm and dry. No rash noted.  Psychiatric: Affect normal.  Vitals reviewed.  Assessment/Plan: Hypothyroidism Non-adherent with medication. Will restart levothyroxine 100 mcg daily. TSH level today and repeat level in 4-6 weeks to determine further medication changes.   GERD (gastroesophageal reflux disease) Reflux exacerbated by pnd which is now being treated. Start prilosec daily x 2 weeks. GERD  diet reviewed. Follow-up if not resolving.    Leeanne Rio, PA-C

## 2016-09-16 NOTE — Progress Notes (Signed)
Pre visit review using our clinic review tool, if applicable. No additional management support is needed unless otherwise documented below in the visit note. 

## 2016-09-16 NOTE — Assessment & Plan Note (Signed)
Reflux exacerbated by pnd which is now being treated. Start prilosec daily x 2 weeks. GERD diet reviewed. Follow-up if not resolving.

## 2016-11-02 ENCOUNTER — Ambulatory Visit (INDEPENDENT_AMBULATORY_CARE_PROVIDER_SITE_OTHER): Payer: Managed Care, Other (non HMO) | Admitting: Family Medicine

## 2016-11-02 ENCOUNTER — Ambulatory Visit (INDEPENDENT_AMBULATORY_CARE_PROVIDER_SITE_OTHER): Payer: Managed Care, Other (non HMO) | Admitting: Internal Medicine

## 2016-11-02 ENCOUNTER — Ambulatory Visit: Payer: Managed Care, Other (non HMO) | Admitting: Internal Medicine

## 2016-11-02 ENCOUNTER — Encounter: Payer: Self-pay | Admitting: Family Medicine

## 2016-11-02 ENCOUNTER — Encounter: Payer: Self-pay | Admitting: General Practice

## 2016-11-02 ENCOUNTER — Encounter: Payer: Self-pay | Admitting: Internal Medicine

## 2016-11-02 VITALS — BP 123/71 | HR 68 | Temp 98.3°F | Resp 16 | Ht 72.0 in | Wt 187.4 lb

## 2016-11-02 VITALS — BP 112/66 | HR 64 | Ht 72.0 in | Wt 187.0 lb

## 2016-11-02 DIAGNOSIS — K219 Gastro-esophageal reflux disease without esophagitis: Secondary | ICD-10-CM | POA: Diagnosis not present

## 2016-11-02 DIAGNOSIS — E038 Other specified hypothyroidism: Secondary | ICD-10-CM

## 2016-11-02 DIAGNOSIS — E785 Hyperlipidemia, unspecified: Secondary | ICD-10-CM

## 2016-11-02 DIAGNOSIS — R1319 Other dysphagia: Secondary | ICD-10-CM

## 2016-11-02 DIAGNOSIS — R1310 Dysphagia, unspecified: Secondary | ICD-10-CM | POA: Diagnosis not present

## 2016-11-02 DIAGNOSIS — Z0000 Encounter for general adult medical examination without abnormal findings: Secondary | ICD-10-CM | POA: Diagnosis not present

## 2016-11-02 LAB — BASIC METABOLIC PANEL
BUN: 19 mg/dL (ref 6–23)
CALCIUM: 9.2 mg/dL (ref 8.4–10.5)
CO2: 31 mEq/L (ref 19–32)
Chloride: 104 mEq/L (ref 96–112)
Creatinine, Ser: 1.13 mg/dL (ref 0.40–1.50)
GFR: 71.14 mL/min (ref >60.00)
GLUCOSE: 115 mg/dL — AB (ref 70–99)
POTASSIUM: 3.7 meq/L (ref 3.5–5.1)
SODIUM: 141 meq/L (ref 135–145)

## 2016-11-02 LAB — CBC WITH DIFFERENTIAL/PLATELET
BASOS ABS: 0.1 10*3/uL (ref 0.0–0.1)
BASOS PCT: 1.1 % (ref 0.0–3.0)
EOS PCT: 3.3 % (ref 0.0–5.0)
Eosinophils Absolute: 0.2 10*3/uL (ref 0.0–0.7)
HEMATOCRIT: 40.7 % (ref 39.0–52.0)
Hemoglobin: 13.9 g/dL (ref 13.0–17.0)
LYMPHS ABS: 1.3 10*3/uL (ref 0.7–4.0)
LYMPHS PCT: 21.2 % (ref 12.0–46.0)
MCHC: 34.2 g/dL (ref 30.0–36.0)
MCV: 88.4 fl (ref 78.0–100.0)
MONOS PCT: 7 % (ref 3.0–12.0)
Monocytes Absolute: 0.4 10*3/uL (ref 0.1–1.0)
NEUTROS ABS: 4.1 10*3/uL (ref 1.4–7.7)
NEUTROS PCT: 67.4 % (ref 43.0–77.0)
PLATELETS: 524 10*3/uL — AB (ref 150.0–400.0)
RBC: 4.61 Mil/uL (ref 4.22–5.81)
RDW: 13.9 % (ref 11.5–15.5)
WBC: 6.1 10*3/uL (ref 4.0–10.5)

## 2016-11-02 LAB — LIPID PANEL
CHOLESTEROL: 174 mg/dL (ref 0–200)
HDL: 36.3 mg/dL — ABNORMAL LOW (ref >39.00)
LDL CALC: 98 mg/dL (ref 0–99)
NONHDL: 137.89
Total CHOL/HDL Ratio: 5
Triglycerides: 197 mg/dL — ABNORMAL HIGH (ref 0.0–149.0)
VLDL: 39.4 mg/dL (ref 0.0–40.0)

## 2016-11-02 LAB — HEPATIC FUNCTION PANEL
ALK PHOS: 65 U/L (ref 39–117)
ALT: 19 U/L (ref 0–53)
AST: 17 U/L (ref 0–37)
Albumin: 4.3 g/dL (ref 3.5–5.2)
BILIRUBIN DIRECT: 0.1 mg/dL (ref 0.0–0.3)
BILIRUBIN TOTAL: 0.7 mg/dL (ref 0.2–1.2)
TOTAL PROTEIN: 6.8 g/dL (ref 6.0–8.3)

## 2016-11-02 LAB — TSH: TSH: 1.26 u[IU]/mL (ref 0.35–4.50)

## 2016-11-02 MED ORDER — OMEPRAZOLE 20 MG PO CPDR: 20.0000 mg | DELAYED_RELEASE_CAPSULE | Freq: Every day | ORAL | 3 refills | Status: DC

## 2016-11-02 NOTE — Assessment & Plan Note (Signed)
Chronic problem.  Asymptomatic.  Check labs.  Adjust meds prn  

## 2016-11-02 NOTE — Patient Instructions (Signed)
Reduce caffeine per Dr Carlean Purl.  Today we are giving you a handout on GERD to read and follow.   Take your Prilosec before supper.   I appreciate the opportunity to care for you. Gregory Rusk, MD, [facility]

## 2016-11-02 NOTE — Patient Instructions (Signed)
Follow up in 1 year or as needed We'll notify you of your lab results and make any changes if needed Keep up the good work on healthy diet and regular exercise- you look great! Don't forget to call your dermatologist! Call with any questions or concerns Happy Labor Day!

## 2016-11-02 NOTE — Assessment & Plan Note (Signed)
Pt's PE WNL.  UTD on urology and colonoscopy.  Declines flu shot.  Check labs.  Anticipatory guidance provided.

## 2016-11-02 NOTE — Progress Notes (Signed)
Pre visit review using our clinic review tool, if applicable. No additional management support is needed unless otherwise documented below in the visit note. 

## 2016-11-02 NOTE — Progress Notes (Signed)
   Subjective:    Patient ID: Gregory Webb, male    DOB: [birth year], 56 y.o.   MRN: [id number]  HPI CPE- UTD on urology [facility]) and colonoscopy.  Declines flu shot.     Review of Systems Patient reports no vision/hearing changes, anorexia, fever ,adenopathy, persistant/recurrent hoarseness, swallowing issues, chest pain, palpitations, edema, persistant/recurrent cough, hemoptysis, dyspnea (rest,exertional, paroxysmal nocturnal), gastrointestinal  bleeding (melena, rectal bleeding), abdominal pain, excessive heart burn, GU symptoms (dysuria, hematuria, voiding/incontinence issues) syncope, focal weakness, memory loss, numbness & tingling, skin/hair/nail changes, depression, anxiety, abnormal bruising/bleeding, musculoskeletal symptoms/signs.     Objective:   Physical Exam General Appearance:    Alert, cooperative, no distress, appears stated age  Head:    Normocephalic, without obvious abnormality, atraumatic  Eyes:    PERRL, conjunctiva/corneas clear, EOM's intact, fundi    benign, both eyes       Ears:    Normal TM's and external ear canals, both ears  Nose:   Nares normal, septum midline, mucosa normal, no drainage   or sinus tenderness  Throat:   Lips, mucosa, and tongue normal; teeth and gums normal  Neck:   Supple, symmetrical, trachea midline, no adenopathy;       thyroid:  No enlargement/tenderness/nodules  Back:     Symmetric, no curvature, ROM normal, no CVA tenderness  Lungs:     Clear to auscultation bilaterally, respirations unlabored  Chest wall:    No tenderness or deformity  Heart:    Regular rate and rhythm, S1 and S2 normal, no murmur, rub   or gallop  Abdomen:     Soft, non-tender, bowel sounds active all four quadrants,    no masses, no organomegaly  Genitalia:    Deferred to urology  Rectal:    Extremities:   Extremities normal, atraumatic, no cyanosis or edema  Pulses:   2+ and symmetric all extremities  Skin:   Skin color, texture, turgor normal, no  rashes or lesions  Lymph nodes:   Cervical, supraclavicular, and axillary nodes normal  Neurologic:   CNII-XII intact. Normal strength, sensation and reflexes      throughout          Assessment & Plan:

## 2016-11-02 NOTE — Progress Notes (Signed)
Gregory Webb 56 y.o. 05-08-1960 [id number]  Assessment & Plan:   Encounter Diagnoses  Name Primary?  . Gastroesophageal reflux disease, esophagitis presence not specified   . Esophageal dysphagia - improved Yes  . Gastroesophageal reflux disease without esophagitis     The patient has a history of esophageal ring with dilation in 2012, he got off his PPI, he improved back on it and is doing well at this time. There is also history of globus. At this point I think he is doing well and symptoms are almost nearly resolved. He will try to take his omeprazole before a meal and with his thyroid supplementation we have decided its best to take that before supper so there is no malabsorption of his Synthroid. He will see back as needed. Reflux lifestyle changes are reviewed and he is instructed to reduce caffeine  I appreciate the opportunity to care for this patient. CC: Midge Minium, MD  Subjective:   Chief Complaint:Dysphagia heartburn  HPI The patient is a 56 year old man known to me from prior upper endoscopy in 2 colonoscopies with history of colon polyps and reflux. He had a lower esophageal ring dilated in 2012. He did well after that with the exception of some symptoms of globus. Overall though symptoms had responded to PPI and after the dilation. Somehow he got off of those medications, started having some intermittent swallowing difficulty and globus, saw primary care, Elyn Aquas PA-C, and was restarted on a PPI and is done much better since then with only rare dysphagia. He's not had to regurgitate any food and he says he feels well. He has been taking the omeprazole at bedtime. He drinks 2 or 3 cups of coffee a day. He does not smoke. He has not gained a lot of weight nor does he have a lot of belly fat which could worsen reflux. No Known Allergies Current Meds  Medication Sig  . clonazePAM (KLONOPIN) 0.5 MG tablet Take 0.5 tablets (0.25 mg total) by mouth as needed  for anxiety.  . fluticasone (FLONASE) 50 MCG/ACT nasal spray Place 2 sprays into both nostrils daily.  . Ipratropium-Albuterol (COMBIVENT) 20-100 MCG/ACT AERS respimat Please use 1 puff every 4-6 hours as needed.  Marland Kitchen levothyroxine (SYNTHROID, LEVOTHROID) 100 MCG tablet Take 1 tablet (100 mcg total) by mouth daily.  . methocarbamol (ROBAXIN) 750 MG tablet   . omeprazole (PRILOSEC) 20 MG capsule Take 1 capsule (20 mg total) by mouth daily before supper.   Past Medical History:  Diagnosis Date  . Adenomatous colon polyp   . Allergic rhinitis   . Asthma    prn Combivent   . Diverticulosis   . GERD (gastroesophageal reflux disease)   . Hemorrhoids   . Hyperlipidemia 08/2009   LDL 128, HDL 49.40,TG 78. No premature CAD/MI in [place]  . Hypothyroidism   . Low back pain syndrome   . Lower esophageal ring 06/2010   dilated  . Prostatitis    X3; Dr Lawerance Bach   Past Surgical History:  Procedure Laterality Date  . COLONOSCOPY  06/08/2010   1 adenoma, mild diverticulosis and hemorrhoids plan for repeat 2017 approximately  . SHOULDER SURGERY  2008   right  . SHOULDER SURGERY Left 2018  . UPPER GASTROINTESTINAL ENDOSCOPY  06/08/2010   Lower esophageal ring, dilated to 50 [place]. Mild gastritis, thoracic inlet patch.   Review of Systems As per history of present illness  Objective:   Physical Exam BP 112/66   Pulse 64  Ht 6' (1.829 m)   Wt 187 lb (84.8 kg)   BMI 25.36 kg/m  Eyes anicteric Appropriate mood and affect  15 minutes time spent with patient > half in counseling coordination of care

## 2016-11-02 NOTE — Assessment & Plan Note (Signed)
Chronic problem.  Attempting to control w/ healthy diet and regular exercise.  Check labs.  Adjust tx prn  

## 2016-11-02 NOTE — Assessment & Plan Note (Signed)
Improved on PPI - stay on that see me prn GERD lifestyle handout Take PPI before a meal instead of at bedtime

## 2016-11-03 ENCOUNTER — Encounter: Payer: Self-pay | Admitting: General Practice

## 2017-04-24 ENCOUNTER — Encounter: Payer: Self-pay | Admitting: General Practice

## 2017-04-29 ENCOUNTER — Other Ambulatory Visit: Payer: Self-pay | Admitting: Physician Assistant

## 2017-04-29 DIAGNOSIS — K219 Gastro-esophageal reflux disease without esophagitis: Secondary | ICD-10-CM

## 2017-05-10 ENCOUNTER — Encounter: Payer: Self-pay | Admitting: General Practice

## 2017-05-10 ENCOUNTER — Ambulatory Visit: Payer: Managed Care, Other (non HMO) | Admitting: Family Medicine

## 2017-05-10 ENCOUNTER — Encounter: Payer: Self-pay | Admitting: Family Medicine

## 2017-05-10 ENCOUNTER — Other Ambulatory Visit: Payer: Self-pay

## 2017-05-10 VITALS — BP 108/70 | HR 66 | Temp 98.1°F | Ht 72.0 in | Wt 189.6 lb

## 2017-05-10 DIAGNOSIS — J069 Acute upper respiratory infection, unspecified: Secondary | ICD-10-CM | POA: Diagnosis not present

## 2017-05-10 MED ORDER — AZITHROMYCIN 250 MG PO TABS: ORAL_TABLET | ORAL | 0 refills | Status: DC

## 2017-05-10 NOTE — Progress Notes (Signed)
Subjective   CC:  Chief Complaint  Patient presents with  . Sinus Problem    Patient states he has sinus pressure with headaches and productive cough with green/yellow mucus     HPI: Gregory Webb is a 57 y.o. male who presents to the office today to address the problems listed above in the chief complaint.  Patient complains of typical URI symptoms including nasal congestion, mild sore throat, cough, and mild malaise.  The symptoms have been present for 4-5 days ago. Daughter has same and was treated with abx yesterday. He denies high fever or productive cough, shortness of breath or significant GI symptoms.  Over-the-counter cold medicines have been minimally or mildly helpful. He has asthma but it is not currently affected.   I reviewed the patients updated PMH, FH, and SocHx.    Patient Active Problem List   Diagnosis Date Noted  . Physical exam 02/02/2015  . GERD (gastroesophageal reflux disease) 03/19/2013  . BPH (benign prostatic hypertrophy) 10/19/2011  . Hx of adenomatous polyp of colon 08/09/2010  . Hypothyroidism 09/14/2009  . Hyperlipidemia 09/14/2009  . ALLERGIC RHINITIS 08/17/2009   Current Meds  Medication Sig  . clonazePAM (KLONOPIN) 0.5 MG tablet Take 0.5 tablets (0.25 mg total) by mouth as needed for anxiety.  . Ipratropium-Albuterol (COMBIVENT) 20-100 MCG/ACT AERS respimat Please use 1 puff every 4-6 hours as needed.  Marland Kitchen levothyroxine (SYNTHROID, LEVOTHROID) 100 MCG tablet Take 1 tablet (100 mcg total) by mouth daily.  Marland Kitchen omeprazole (PRILOSEC) 20 MG capsule TAKE 1 CAPSULE BY MOUTH DAILY    Review of Systems: Constitutional: Negative for fever malaise or anorexia Cardiovascular: negative for chest pain Respiratory: negative for SOB or pleuritic chest pain Gastrointestinal: negative for abdominal pain  Objective  Vitals: BP 108/70   Pulse 66   Temp 98.1 F (36.7 C)   Ht 6' (1.829 m)   Wt 189 lb 9.6 oz (86 kg)   BMI 25.71 kg/m  General: no acute  respiratory distress  Psych:  Alert and oriented, normal mood and affect HEENT: Normocephalic, nasal congestion present, TMs w/o erythema, OP with erythema w/o exudate, + cervical LAD, supple neck  Cardiovascular:  RRR without murmur or gallop. no peripheral edema Respiratory:  Good breath sounds bilaterally, CTAB with normal respiratory effort Skin:  Warm, no rashes Neurologic:   Mental status is normal. normal gait  Assessment  1. Upper respiratory tract infection, unspecified type      Plan   URI, viral: discussed dx; no sign or sx of bacterial infection is present. Treat supportively with antihistamines, decongestants, and/or cough meds. See AVS for care instructions. However, pt preferred trial of Zpak. Education given.   Follow up: Return if symptoms worsen or fail to improve.    Commons side effects, risks, benefits, and alternatives for medications and treatment plan prescribed today were discussed, and the patient expressed understanding of the given instructions. Patient is instructed to call or message via MyChart if he/she has any questions or concerns regarding our treatment plan. No barriers to understanding were identified. We discussed Red Flag symptoms and signs in detail. Patient expressed understanding regarding what to do in case of urgent or emergency type symptoms.   Medication list was reconciled, printed and provided to the patient in AVS. Patient instructions and summary information was reviewed with the patient as documented in the AVS. This note was prepared with assistance of Dragon voice recognition software. Occasional wrong-word or sound-a-like substitutions may have occurred due to the  inherent limitations of voice recognition software  No orders of the defined types were placed in this encounter.  Meds ordered this encounter  Medications  . azithromycin (ZITHROMAX) 250 MG tablet    Sig: Take 2 tabs today, then 1 tab daily for 4 days    Dispense:  1 each      Refill:  0

## 2017-05-10 NOTE — Patient Instructions (Signed)
Please follow up if symptoms do not improve or as needed.   Mucinex DM twice a day with claritin D.   Can use Delsym if cough gets worse.   Zpak - the antibiotic may or may not be helpful.   Upper Respiratory Infection, Adult Most upper respiratory infections (URIs) are a viral infection of the air passages leading to the lungs. A URI affects the nose, throat, and upper air passages. The most common type of URI is nasopharyngitis and is typically referred to as "the common cold." URIs run their course and usually go away on their own. Most of the time, a URI does not require medical attention, but sometimes a bacterial infection in the upper airways can follow a viral infection. This is called a secondary infection. Sinus and middle ear infections are common types of secondary upper respiratory infections. Bacterial pneumonia can also complicate a URI. A URI can worsen asthma and chronic obstructive pulmonary disease (COPD). Sometimes, these complications can require emergency medical care and may be life threatening. What are the causes? Almost all URIs are caused by viruses. A virus is a type of germ and can spread from one person to another. What increases the risk? You may be at risk for a URI if:  You smoke.  You have chronic heart or lung disease.  You have a weakened defense (immune) system.  You are very young or very old.  You have nasal allergies or asthma.  You work in crowded or poorly ventilated areas.  You work in health care facilities or schools.  What are the signs or symptoms? Symptoms typically develop 2-3 days after you come in contact with a cold virus. Most viral URIs last 7-10 days. However, viral URIs from the influenza virus (flu virus) can last 14-18 days and are typically more severe. Symptoms may include:  Runny or stuffy (congested) nose.  Sneezing.  Cough.  Sore throat.  Headache.  Fatigue.  Fever.  Loss of appetite.  Pain in your  forehead, behind your eyes, and over your cheekbones (sinus pain).  Muscle aches.  How is this diagnosed? Your health care provider may diagnose a URI by:  Physical exam.  Tests to check that your symptoms are not due to another condition such as: ? Strep throat. ? Sinusitis. ? Pneumonia. ? Asthma.  How is this treated? A URI goes away on its own with time. It cannot be cured with medicines, but medicines may be prescribed or recommended to relieve symptoms. Medicines may help:  Reduce your fever.  Reduce your cough.  Relieve nasal congestion.  Follow these instructions at home:  Take medicines only as directed by your health care provider.  Gargle warm saltwater or take cough drops to comfort your throat as directed by your health care provider.  Use a warm mist humidifier or inhale steam from a shower to increase air moisture. This may make it easier to breathe.  Drink enough fluid to keep your urine clear or pale yellow.  Eat soups and other clear broths and maintain good nutrition.  Rest as needed.  Return to work when your temperature has returned to normal or as your health care provider advises. You may need to stay home longer to avoid infecting others. You can also use a face mask and careful hand washing to prevent spread of the virus.  Increase the usage of your inhaler if you have asthma.  Do not use any tobacco products, including cigarettes, chewing tobacco, or electronic  cigarettes. If you need help quitting, ask your health care provider. How is this prevented? The best way to protect yourself from getting a cold is to practice good hygiene.  Avoid oral or hand contact with people with cold symptoms.  Wash your hands often if contact occurs.  There is no clear evidence that vitamin C, vitamin E, echinacea, or exercise reduces the chance of developing a cold. However, it is always recommended to get plenty of rest, exercise, and practice good  nutrition. Contact a health care provider if:  You are getting worse rather than better.  Your symptoms are not controlled by medicine.  You have chills.  You have worsening shortness of breath.  You have brown or red mucus.  You have yellow or brown nasal discharge.  You have pain in your face, especially when you bend forward.  You have a fever.  You have swollen neck glands.  You have pain while swallowing.  You have white areas in the back of your throat. Get help right away if:  You have severe or persistent: ? Headache. ? Ear pain. ? Sinus pain. ? Chest pain.  You have chronic lung disease and any of the following: ? Wheezing. ? Prolonged cough. ? Coughing up blood. ? A change in your usual mucus.  You have a stiff neck.  You have changes in your: ? Vision. ? Hearing. ? Thinking. ? Mood. This information is not intended to replace advice given to you by your health care provider. Make sure you discuss any questions you have with your health care provider. Document Released: 08/17/2000 Document Revised: 10/25/2015 Document Reviewed: 05/29/2013 Elsevier Interactive Patient Education  [employer].

## 2017-08-03 ENCOUNTER — Other Ambulatory Visit: Payer: Self-pay | Admitting: Physician Assistant

## 2017-08-04 NOTE — Telephone Encounter (Signed)
Will defer further refills of patient's medications to PCP  

## 2017-10-15 IMAGING — CT CT T SPINE W/O CM
2 series · 12 of 20 positions shown, 15 images · non-contrast
Comparison: 01/22/2015 radiograph

CLINICAL DATA: 54-year-old male with acute mid back pain following
fall off ladder today. Initial encounter.

EXAM:
CT THORACIC SPINE WITHOUT CONTRAST
TECHNIQUE: Multidetector CT imaging of the thoracic spine was performed without
intravenous contrast administration. Multiplanar CT image
reconstructions were also generated.

[Series 4: spine 2.0 b31s st · axial · 0.31mm/px · z∈[-380,-72]mm · 9 of 182 slices shown, 12 images]
[im 14/182  soft-tissue]
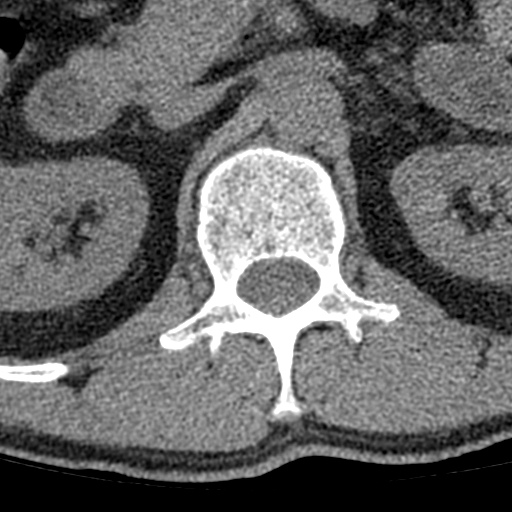
[im 14/182  bone]
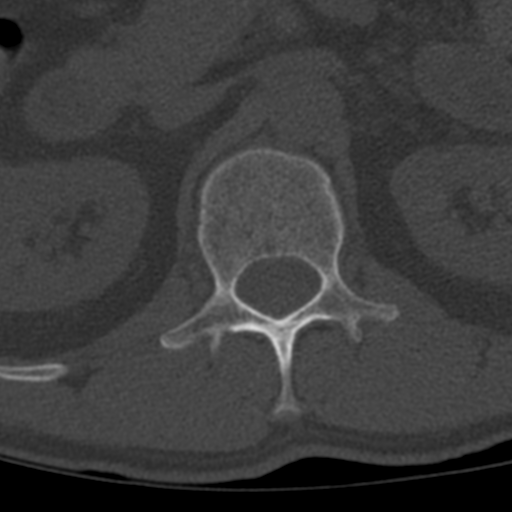
[im 42/182  bone]
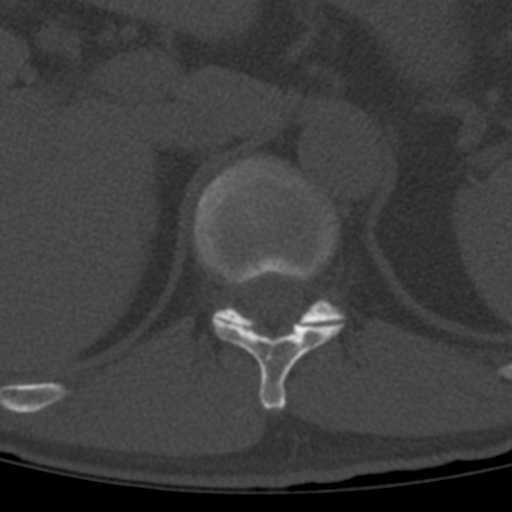
[im 56/182  bone]
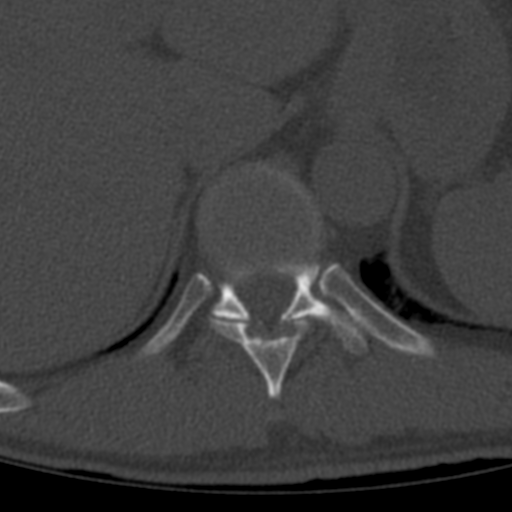
[im 70/182  bone]
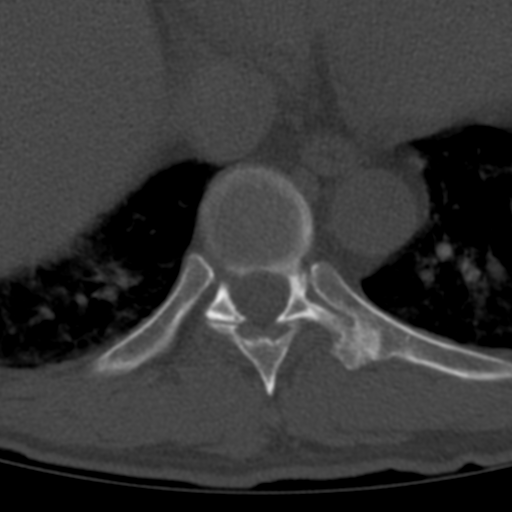
[im 98/182  soft-tissue]
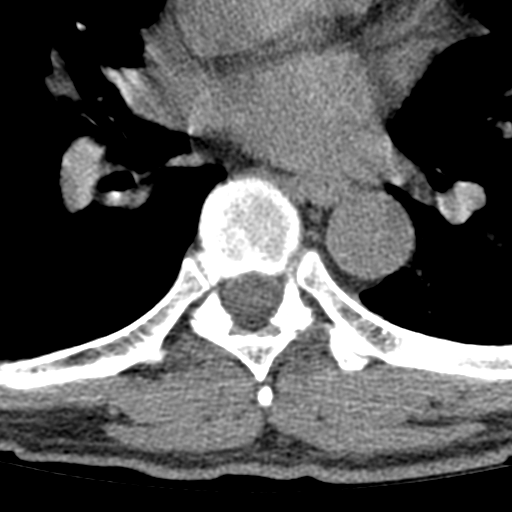
[im 98/182  bone]
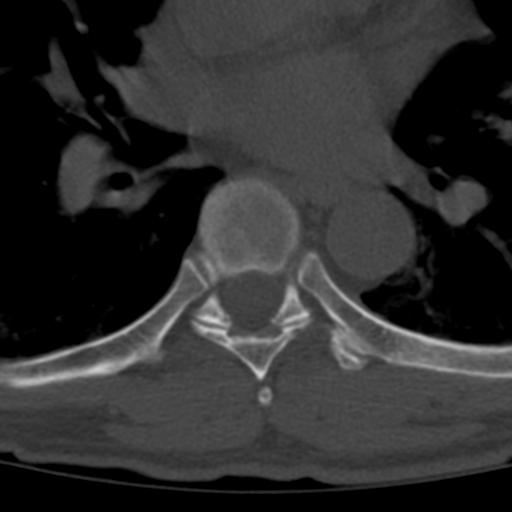
[im 112/182  bone]
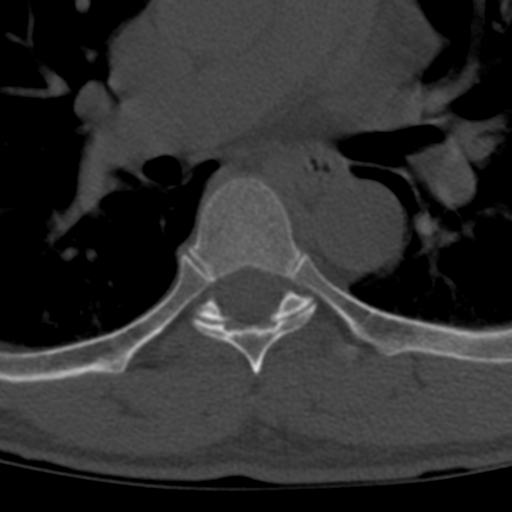
[im 126/182  bone]
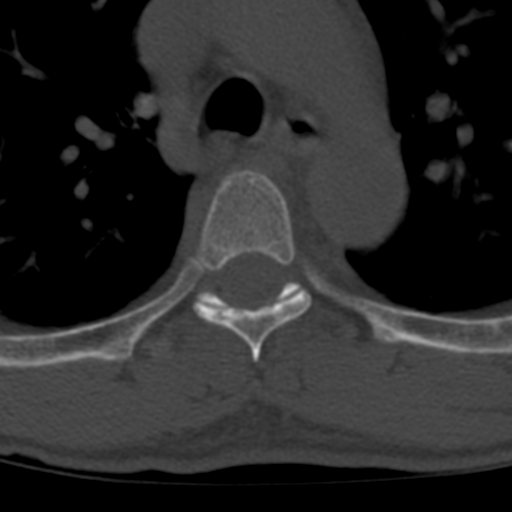
[im 154/182  bone]
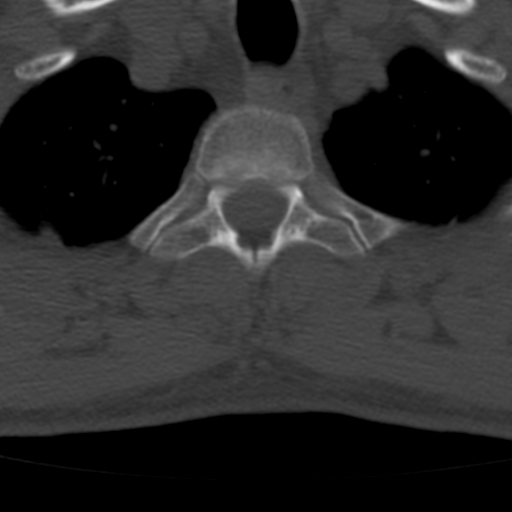
[im 168/182  soft-tissue]
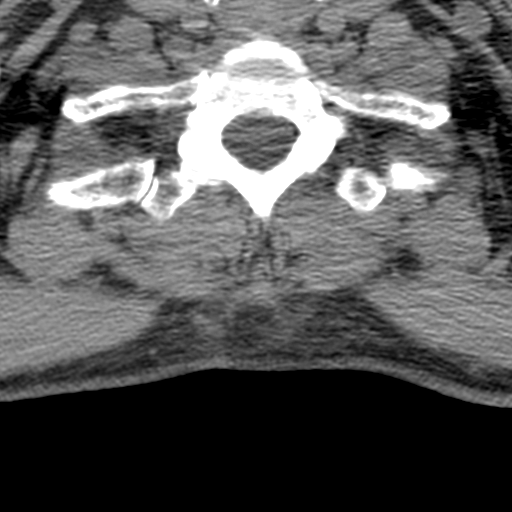
[im 168/182  bone]
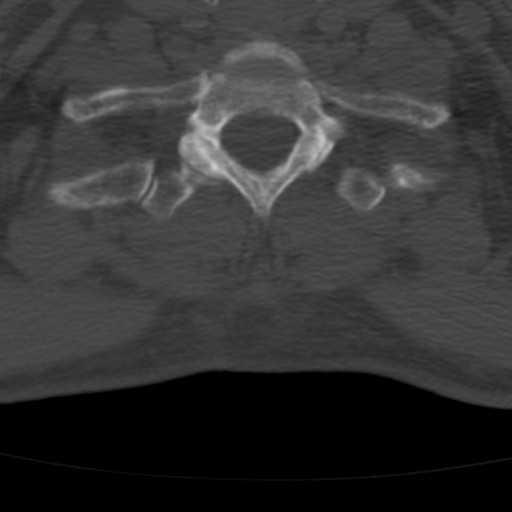

[Series 7: spine 2.0 coronal st · coronal · 0.29mm/px · 3 of 45 slices shown]
[im 9/45  bone]
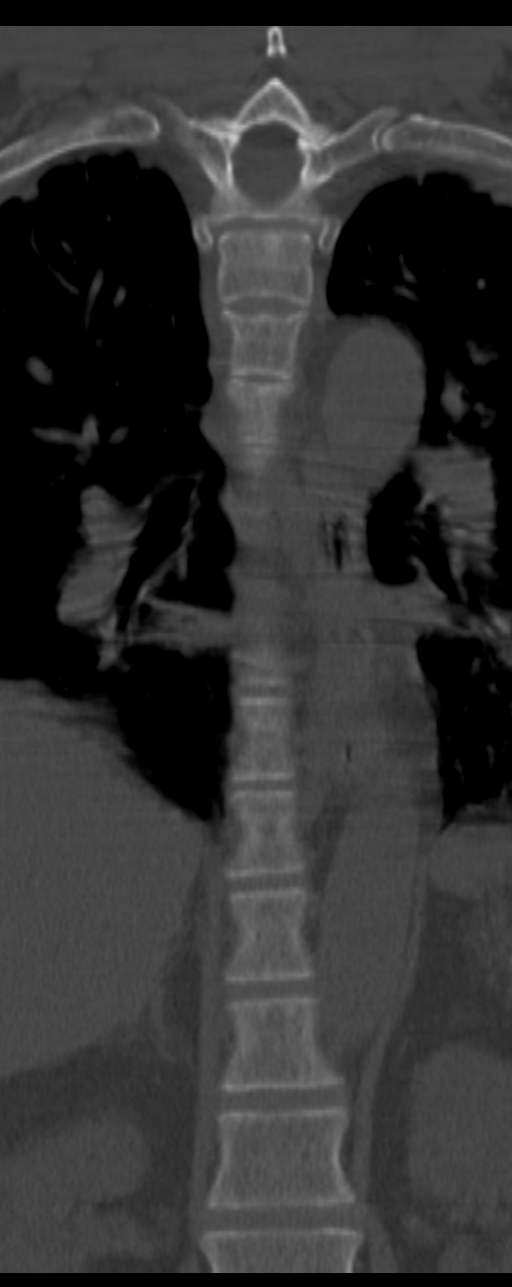
[im 18/45  bone]
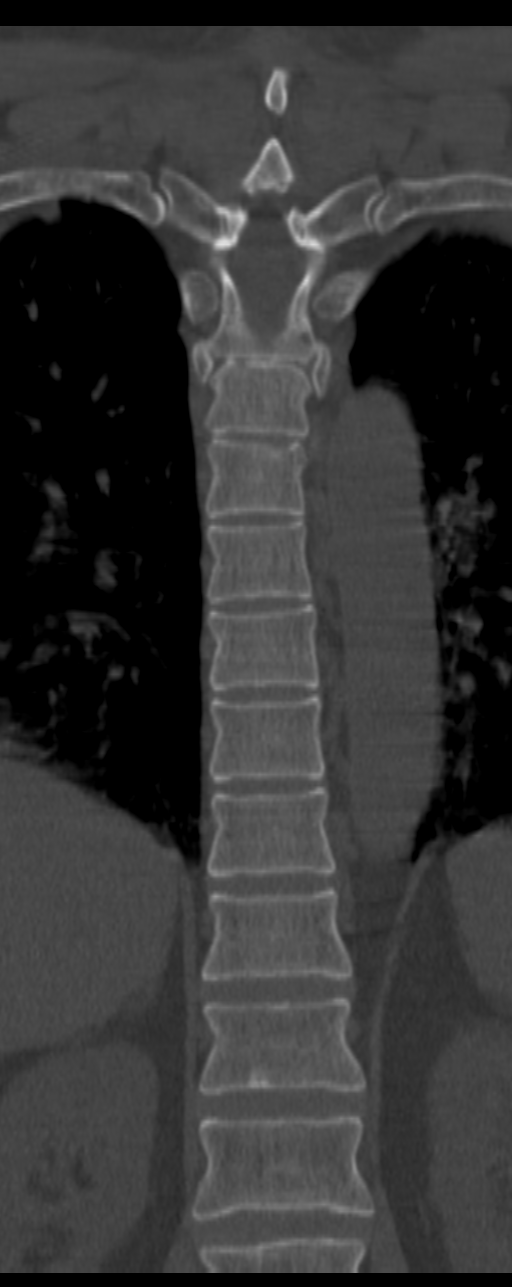
[im 27/45  bone]
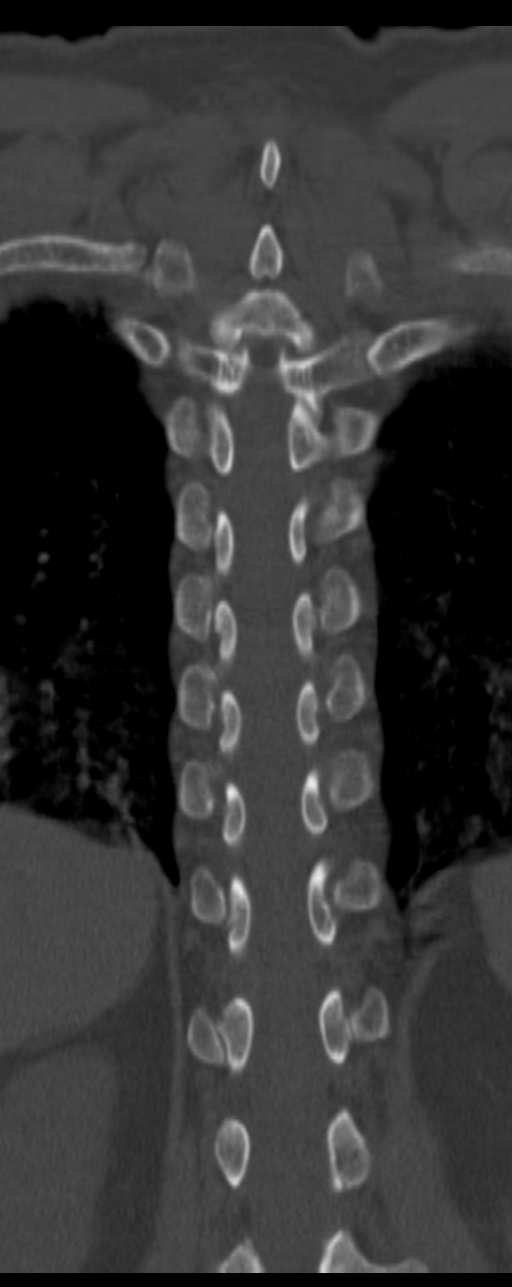

[12 of 20 positions shown; findings below may reference images not displayed]

FINDINGS: Normal alignment noted.

There are acute superior endplate compression fractures of T4 and
T5-approximately 10-15%. There may be minimal acute superior
endplate compression fractures of T3 and T6 as well.

There is no evidence of bony retropulsion.

The visualize lungs are unremarkable except for minimal basilar
atelectasis.

No other significant abnormalities are identified.
IMPRESSION: 10-15% acute superior endplate compression fractures of T4 and T5.
Possible minimal acute superior endplate compression fractures of T3
in T6 as well. No evidence of subluxation or bony retropulsion.

## 2017-10-15 IMAGING — CR DG THORACIC SPINE 4+V
3 series · 3 of 3 positions shown · non-contrast
Comparison: None.

CLINICAL DATA: Fell 6' off a ladder at [DATE] today; pain is in
back between scapula and mid sternum; states he landed on his back

EXAM:
THORACIC SPINE - 4+ VIEW

[w t-spine lat *]
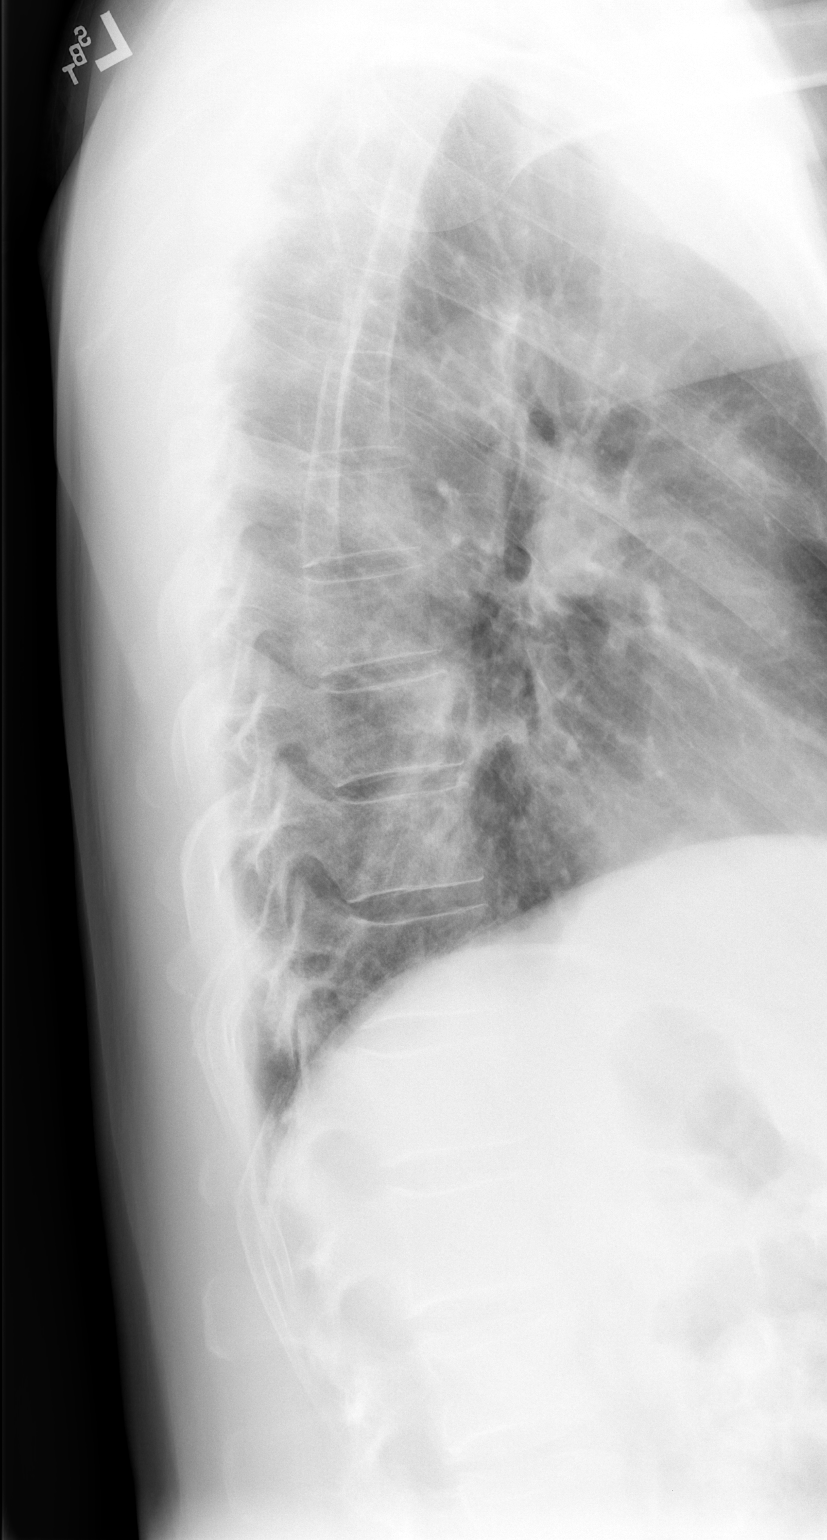

[w swimmers view]
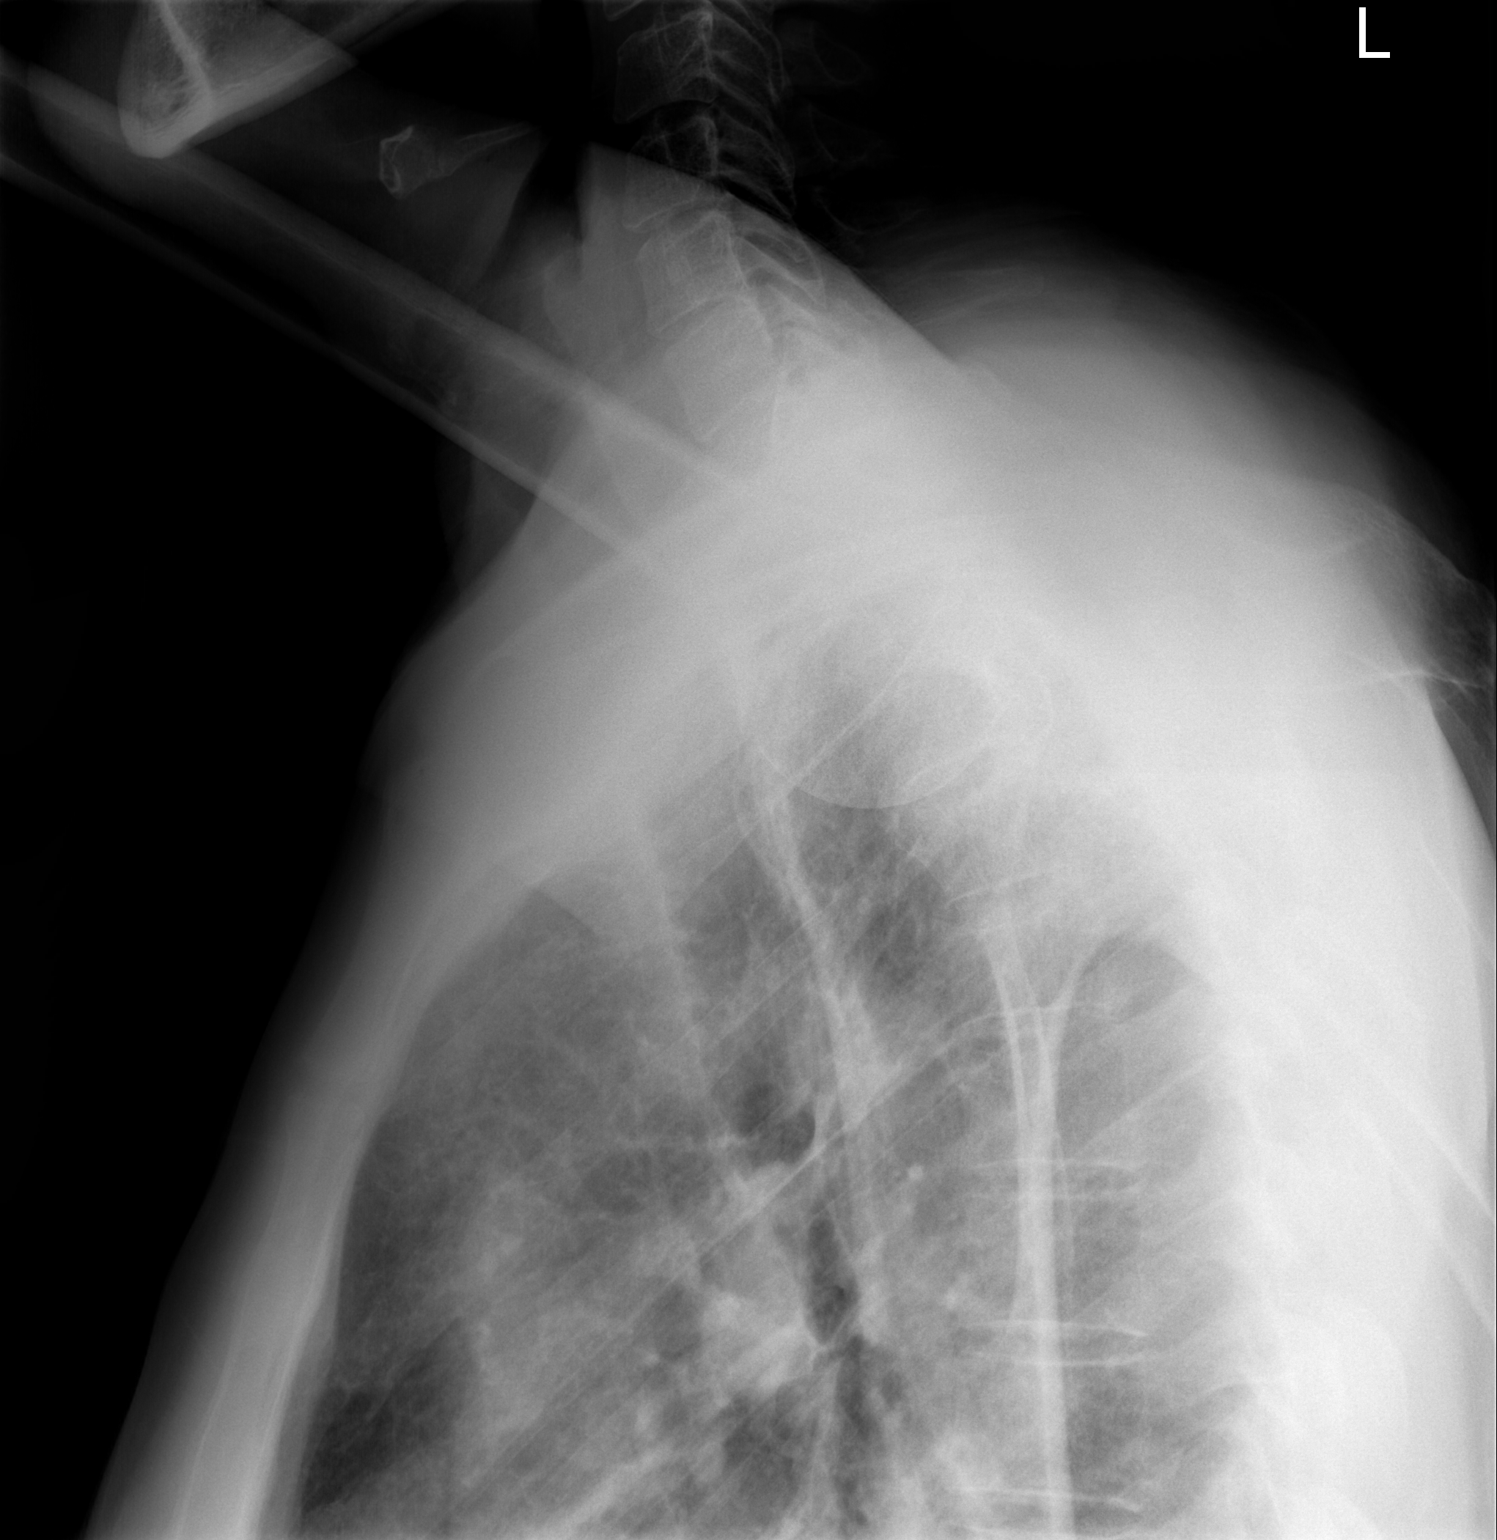

[w t-spine a.p. *]
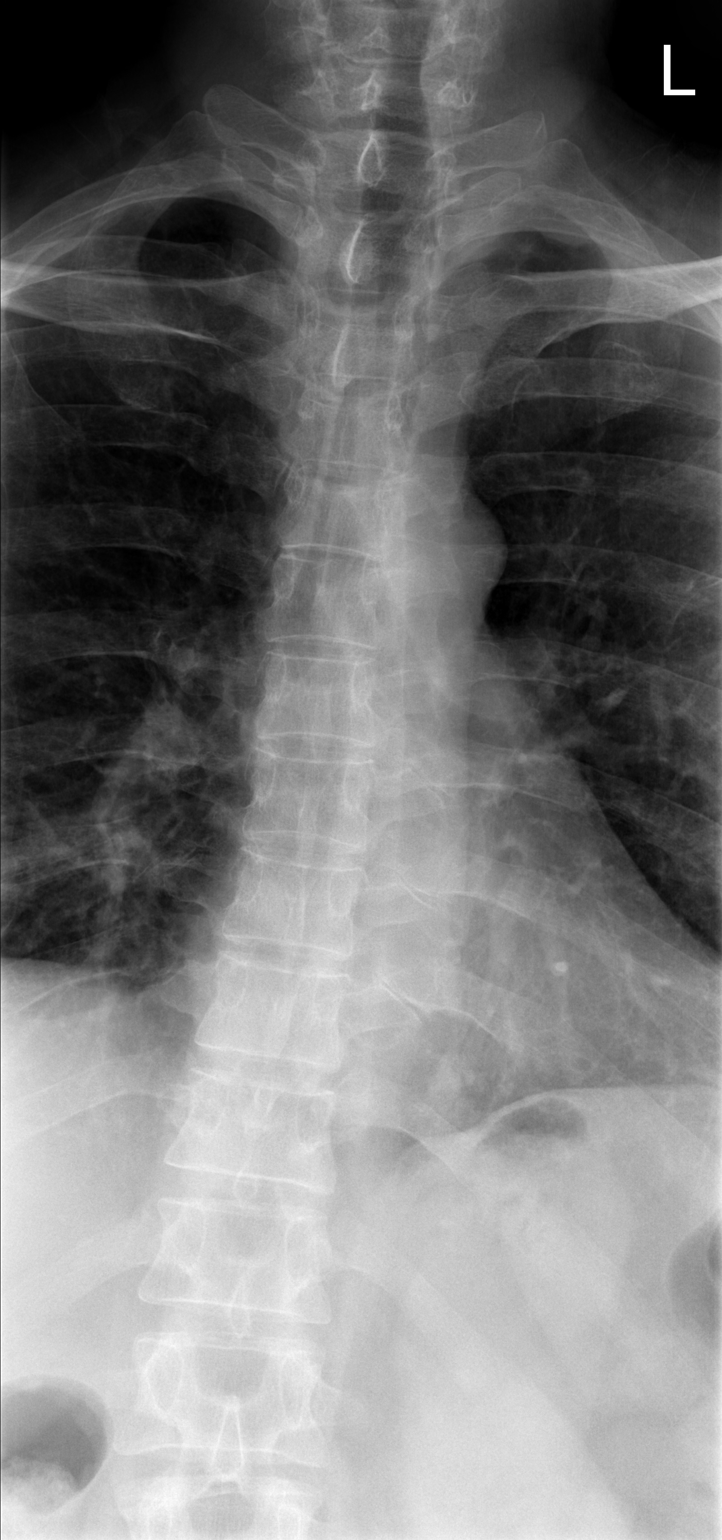

[3 of 3 positions shown; findings below may reference images not displayed]

FINDINGS: Mild compression fracture deformity of an upper thoracic vertebral
body approximately T5 is suspected on the lateral radiograph,
although not convincingly evident on the frontal radiograph. The
posterior elements are incompletely evaluated. There is no
significant kyphotic deformity.
IMPRESSION: 1. Possible mild compression fracture deformity of upper thoracic
vertebral body. CT thoracic spine without contrast may be useful for
further characterization if clinically relevant.

## 2017-11-03 ENCOUNTER — Encounter: Payer: Self-pay | Admitting: Family Medicine

## 2017-11-03 ENCOUNTER — Other Ambulatory Visit: Payer: Self-pay

## 2017-11-03 ENCOUNTER — Other Ambulatory Visit: Payer: Self-pay | Admitting: General Practice

## 2017-11-03 ENCOUNTER — Ambulatory Visit (INDEPENDENT_AMBULATORY_CARE_PROVIDER_SITE_OTHER): Payer: Managed Care, Other (non HMO) | Admitting: Family Medicine

## 2017-11-03 VITALS — BP 110/70 | HR 62 | Temp 98.0°F | Resp 16 | Ht 72.0 in | Wt 188.0 lb

## 2017-11-03 DIAGNOSIS — Z23 Encounter for immunization: Secondary | ICD-10-CM | POA: Diagnosis not present

## 2017-11-03 DIAGNOSIS — Z0000 Encounter for general adult medical examination without abnormal findings: Secondary | ICD-10-CM

## 2017-11-03 DIAGNOSIS — E038 Other specified hypothyroidism: Secondary | ICD-10-CM

## 2017-11-03 LAB — BASIC METABOLIC PANEL
BUN: 24 mg/dL — ABNORMAL HIGH (ref 6–23)
CHLORIDE: 105 meq/L (ref 96–112)
CO2: 27 mEq/L (ref 19–32)
CREATININE: 1.03 mg/dL (ref 0.40–1.50)
Calcium: 9.4 mg/dL (ref 8.4–10.5)
GFR: 78.88 mL/min (ref >60.00)
Glucose, Bld: 103 mg/dL — ABNORMAL HIGH (ref 70–99)
Potassium: 4.2 mEq/L (ref 3.5–5.1)
Sodium: 139 mEq/L (ref 135–145)

## 2017-11-03 LAB — LIPID PANEL
CHOL/HDL RATIO: 5
Cholesterol: 189 mg/dL (ref 0–200)
HDL: 36.8 mg/dL — AB (ref >39.00)
LDL Cholesterol: 130 mg/dL — ABNORMAL HIGH (ref 0–99)
NONHDL: 152.37
Triglycerides: 110 mg/dL (ref 0.0–149.0)
VLDL: 22 mg/dL (ref 0.0–40.0)

## 2017-11-03 LAB — CBC WITH DIFFERENTIAL/PLATELET
BASOS PCT: 1 % (ref 0.0–3.0)
Basophils Absolute: 0.1 10*3/uL (ref 0.0–0.1)
Eosinophils Absolute: 0.2 10*3/uL (ref 0.0–0.7)
Eosinophils Relative: 3.9 % (ref 0.0–5.0)
HEMATOCRIT: 40.2 % (ref 39.0–52.0)
HEMOGLOBIN: 14 g/dL (ref 13.0–17.0)
LYMPHS PCT: 24.1 % (ref 12.0–46.0)
Lymphs Abs: 1.3 10*3/uL (ref 0.7–4.0)
MCHC: 34.7 g/dL (ref 30.0–36.0)
MCV: 85.8 fl (ref 78.0–100.0)
Monocytes Absolute: 0.5 10*3/uL (ref 0.1–1.0)
Monocytes Relative: 9.8 % (ref 3.0–12.0)
Neutro Abs: 3.4 10*3/uL (ref 1.4–7.7)
Neutrophils Relative %: 61.2 % (ref 43.0–77.0)
Platelets: 485 10*3/uL — ABNORMAL HIGH (ref 150.0–400.0)
RBC: 4.69 Mil/uL (ref 4.22–5.81)
RDW: 14 % (ref 11.5–15.5)
WBC: 5.6 10*3/uL (ref 4.0–10.5)

## 2017-11-03 LAB — HEPATIC FUNCTION PANEL
ALT: 13 U/L (ref 0–53)
AST: 15 U/L (ref 0–37)
Albumin: 4.4 g/dL (ref 3.5–5.2)
Alkaline Phosphatase: 69 U/L (ref 39–117)
BILIRUBIN TOTAL: 0.8 mg/dL (ref 0.2–1.2)
Bilirubin, Direct: 0.1 mg/dL (ref 0.0–0.3)
Total Protein: 7 g/dL (ref 6.0–8.3)

## 2017-11-03 LAB — TSH: TSH: 5.7 u[IU]/mL — ABNORMAL HIGH (ref 0.35–4.50)

## 2017-11-03 MED ORDER — LEVOTHYROXINE SODIUM 112 MCG PO TABS: 112.0000 ug | ORAL_TABLET | Freq: Every day | ORAL | 3 refills | Status: DC

## 2017-11-03 NOTE — Assessment & Plan Note (Signed)
Chronic problem, hx of good control.  Currently asymptomatic.  Check labs.  Adjust meds prn

## 2017-11-03 NOTE — Patient Instructions (Signed)
Follow up in 1 year or as needed We'll notify you of your lab results and make any changes if needed Keep up the good work!  You look great! Call with any questions or concerns Happy Labor Day!!! 

## 2017-11-03 NOTE — Assessment & Plan Note (Signed)
Pt's PE WNL.  UTD on urology, Tdap, colonoscopy.  Flu shot given.  Check labs.  Anticipatory guidance provided.

## 2017-11-03 NOTE — Progress Notes (Signed)
   Subjective:    Patient ID: Gregory Webb, male    DOB: September 02, 1960, 57 y.o.   MRN: [id number]  HPI CPE- UTD on colonoscopy.  Due for flu shot.  UTD on Tdap.  UTD on Urology.  No concerns today.   Review of Systems Patient reports no vision/hearing changes, anorexia, fever ,adenopathy, persistant/recurrent hoarseness, swallowing issues, chest pain, palpitations, edema, persistant/recurrent cough, hemoptysis, dyspnea (rest,exertional, paroxysmal nocturnal), gastrointestinal  bleeding (melena, rectal bleeding), abdominal pain, excessive heart burn, GU symptoms (dysuria, hematuria, voiding/incontinence issues) syncope, focal weakness, memory loss, numbness & tingling, skin/hair/nail changes, depression, anxiety, abnormal bruising/bleeding, musculoskeletal symptoms/signs.     Objective:   Physical Exam General Appearance:    Alert, cooperative, no distress, appears stated age  Head:    Normocephalic, without obvious abnormality, atraumatic  Eyes:    PERRL, conjunctiva/corneas clear, EOM's intact, fundi    benign, both eyes       Ears:    Normal TM's and external ear canals, both ears  Nose:   Nares normal, septum midline, mucosa normal, no drainage   or sinus tenderness  Throat:   Lips, mucosa, and tongue normal; teeth and gums normal  Neck:   Supple, symmetrical, trachea midline, no adenopathy;       thyroid:  No enlargement/tenderness/nodules  Back:     Symmetric, no curvature, ROM normal, no CVA tenderness  Lungs:     Clear to auscultation bilaterally, respirations unlabored  Chest wall:    No tenderness or deformity  Heart:    Regular rate and rhythm, S1 and S2 normal, no murmur, rub   or gallop  Abdomen:     Soft, non-tender, bowel sounds active all four quadrants,    no masses, no organomegaly  Genitalia:    Deferred to urology  Rectal:    Extremities:   Extremities normal, atraumatic, no cyanosis or edema  Pulses:   2+ and symmetric all extremities  Skin:   Skin color,  texture, turgor normal, no rashes or lesions  Lymph nodes:   Cervical, supraclavicular, and axillary nodes normal  Neurologic:   CNII-XII intact. Normal strength, sensation and reflexes      throughout          Assessment & Plan:

## 2017-12-07 ENCOUNTER — Other Ambulatory Visit: Payer: Managed Care, Other (non HMO)

## 2018-03-18 ENCOUNTER — Other Ambulatory Visit: Payer: Self-pay | Admitting: Physician Assistant

## 2018-03-18 DIAGNOSIS — K219 Gastro-esophageal reflux disease without esophagitis: Secondary | ICD-10-CM

## 2018-03-28 ENCOUNTER — Other Ambulatory Visit: Payer: Self-pay

## 2018-03-28 ENCOUNTER — Encounter: Payer: Self-pay | Admitting: Physician Assistant

## 2018-03-28 ENCOUNTER — Ambulatory Visit: Payer: Managed Care, Other (non HMO) | Admitting: Physician Assistant

## 2018-03-28 VITALS — BP 114/78 | HR 54 | Temp 98.2°F | Resp 14 | Ht 72.0 in | Wt 188.0 lb

## 2018-03-28 DIAGNOSIS — J0110 Acute frontal sinusitis, unspecified: Secondary | ICD-10-CM

## 2018-03-28 MED ORDER — AMOXICILLIN-POT CLAVULANATE 875-125 MG PO TABS: 1.0000 | ORAL_TABLET | Freq: Two times a day (BID) | ORAL | 0 refills | Status: DC

## 2018-03-28 NOTE — Progress Notes (Signed)
Patient presents to clinic today c/o 2.5-3 weeks of sinus pressure, sinus headache , nasal drainage and fatigue. Patient also endorses sore throat and cough that is mostly dry but sometimes productive of yellow sputum  Symptoms have worsened over the past 2 days. Now with facial pain. Denies fever, chest pain, SOB. Is taking regular allergy medications as directed. Has taken Sudafed with some improvement in symptoms. Denies recent travel or sick contact.  Past Medical History:  Diagnosis Date  . Adenomatous colon polyp   . Allergic rhinitis   . Asthma    prn Combivent   . Diverticulosis   . GERD (gastroesophageal reflux disease)   . Hemorrhoids   . Hyperlipidemia 08/2009   LDL 128, HDL 49.40,TG 78. No premature CAD/MI in [place]  . Hypothyroidism   . Low back pain syndrome   . Lower esophageal ring 06/2010   dilated  . Prostatitis    X3; Dr Lawerance Bach    Current Outpatient Medications on File Prior to Visit  Medication Sig Dispense Refill  . fluticasone (FLONASE) 50 MCG/ACT nasal spray Place 2 sprays into both nostrils daily. 16 g 6  . Ipratropium-Albuterol (COMBIVENT) 20-100 MCG/ACT AERS respimat Please use 1 puff every 4-6 hours as needed. (Patient taking differently: Inhale 1 puff into the lungs every 6 (six) hours as needed for wheezing or shortness of breath. Please use 1 puff every 4-6 hours as needed.) 1 Inhaler 2  . levothyroxine (SYNTHROID, LEVOTHROID) 112 MCG tablet Take 1 tablet (112 mcg total) by mouth daily. 30 tablet 3  . omeprazole (PRILOSEC) 20 MG capsule TAKE 1 CAPSULE BY MOUTH DAILY 30 capsule 4   No current facility-administered medications on file prior to visit.     No Known Allergies  Family History  Problem Relation Age of Onset  . Breast cancer Mother   . Hyperlipidemia Mother   . Melanoma Mother   . Colon cancer Paternal Grandmother   . Stroke Maternal Grandmother        late 59s  . Heart attack Paternal Grandfather        in 7s  . Stroke Maternal  Grandfather        late 30s  . Diabetes Neg Hx     Social History   Socioeconomic History  . Marital status: Married    Spouse name: Not on file  . Number of children: 2  . Years of education: Not on file  . Highest education level: Not on file  Occupational History    Employer: [employer]  . Financial resource strain: Not on file  . Food insecurity:    Worry: Not on file    Inability: Not on file  . Transportation needs:    Medical: Not on file    Non-medical: Not on file  Tobacco Use  . Smoking status: Former Smoker    Types: Cigarettes    Last attempt to quit: 06/06/1980    Years since quitting: 37.8  . Smokeless tobacco: Never Used  . Tobacco comment: smoked [age] , up to 1 pp WEEK  Substance and Sexual Activity  . Alcohol use: Yes    Alcohol/week: 3.0 - 4.0 standard drinks    Types: 3 - 4 Standard drinks or equivalent per week  . Drug use: No  . Sexual activity: Not on file  Lifestyle  . Physical activity:    Days per week: Not on file    Minutes per session: Not on file  .  Stress: Not on file  Relationships  . Social connections:    Talks on phone: Not on file    Gets together: Not on file    Attends religious service: Not on file    Active member of club or organization: Not on file    Attends meetings of clubs or organizations: Not on file    Relationship status: Not on file  Other Topics Concern  . 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  All other ROS are negative.  BP 114/78   Pulse (!) 54   Temp 98.2 F (36.8 C) (Oral)   Resp 14   Ht 6' (1.829 m)   Wt 188 lb (85.3 kg)   SpO2 96%   BMI 25.50 kg/m   Physical Exam Vitals signs reviewed.  Constitutional:      Appearance: Normal appearance.  HENT:     Head: Normocephalic and  atraumatic.     Right Ear: Tympanic membrane normal.     Left Ear: Tympanic membrane normal.     Nose: Congestion present.     Right Turbinates: Swollen.     Left Turbinates: Swollen.     Right Sinus: Frontal sinus tenderness present.     Left Sinus: Frontal sinus tenderness present.     Mouth/Throat:     Mouth: Mucous membranes are moist.     Tongue: No lesions.     Pharynx: Oropharynx is clear. No posterior oropharyngeal erythema or uvula swelling.     Tonsils: No tonsillar exudate.  Eyes:     Conjunctiva/sclera: Conjunctivae normal.  Neck:     Musculoskeletal: Normal range of motion and neck supple.  Cardiovascular:     Rate and Rhythm: Normal rate and regular rhythm.     Pulses: Normal pulses.     Heart sounds: Normal heart sounds.  Pulmonary:     Effort: Pulmonary effort is normal.     Breath sounds: Normal breath sounds.  Neurological:     Mental Status: He is alert.    Assessment/Plan: 1. Acute non-recurrent frontal sinusitis Rx Augmentin.  Increase fluids.  Rest.  Saline nasal spray.  Probiotic.  Delsym or Tylenol Cold/Sinus as directed.  Humidifier in bedroom.  Call or return to clinic if symptoms are not improving.  - amoxicillin-clavulanate (AUGMENTIN) 875-125 MG tablet; Take 1 tablet by mouth 2 (two) times daily.  Dispense: 14 tablet; Refill: 0   Leeanne Rio, PA-C

## 2018-03-28 NOTE — Patient Instructions (Signed)
Please take antibiotic as directed.  Increase fluid intake.  Use Saline nasal spray.  Take a daily multivitamin. Continue allergy medications. Delsym for cough or can use Tylenol cold/sinus.  Place a humidifier in the bedroom.  Please call or return clinic if symptoms are not improving.  Sinusitis Sinusitis is redness, soreness, and swelling (inflammation) of the paranasal sinuses. Paranasal sinuses are air pockets within the bones of your face (beneath the eyes, the middle of the forehead, or above the eyes). In healthy paranasal sinuses, mucus is able to drain out, and air is able to circulate through them by way of your nose. However, when your paranasal sinuses are inflamed, mucus and air can become trapped. This can allow bacteria and other germs to grow and cause infection. Sinusitis can develop quickly and last only a short time (acute) or continue over a long period (chronic). Sinusitis that lasts for more than 12 weeks is considered chronic.  CAUSES  Causes of sinusitis include:  Allergies.  Structural abnormalities, such as displacement of the cartilage that separates your nostrils (deviated septum), which can decrease the air flow through your nose and sinuses and affect sinus drainage.  Functional abnormalities, such as when the small hairs (cilia) that line your sinuses and help remove mucus do not work properly or are not present. SYMPTOMS  Symptoms of acute and chronic sinusitis are the same. The primary symptoms are pain and pressure around the affected sinuses. Other symptoms include:  Upper toothache.  Earache.  Headache.  Bad breath.  Decreased sense of smell and taste.  A cough, which worsens when you are lying flat.  Fatigue.  Fever.  Thick drainage from your nose, which often is green and may contain pus (purulent).  Swelling and warmth over the affected sinuses. DIAGNOSIS  Your caregiver will perform a physical exam. During the exam, your caregiver  may:  Look in your nose for signs of abnormal growths in your nostrils (nasal polyps).  Tap over the affected sinus to check for signs of infection.  View the inside of your sinuses (endoscopy) with a special imaging device with a light attached (endoscope), which is inserted into your sinuses. If your caregiver suspects that you have chronic sinusitis, one or more of the following tests may be recommended:  Allergy tests.  Nasal culture A sample of mucus is taken from your nose and sent to a lab and screened for bacteria.  Nasal cytology A sample of mucus is taken from your nose and examined by your caregiver to determine if your sinusitis is related to an allergy. TREATMENT  Most cases of acute sinusitis are related to a viral infection and will resolve on their own within 10 days. Sometimes medicines are prescribed to help relieve symptoms (pain medicine, decongestants, nasal steroid sprays, or saline sprays).  However, for sinusitis related to a bacterial infection, your caregiver will prescribe antibiotic medicines. These are medicines that will help kill the bacteria causing the infection.  Rarely, sinusitis is caused by a fungal infection. In theses cases, your caregiver will prescribe antifungal medicine. For some cases of chronic sinusitis, surgery is needed. Generally, these are cases in which sinusitis recurs more than 3 times per year, despite other treatments. HOME CARE INSTRUCTIONS   Drink plenty of water. Water helps thin the mucus so your sinuses can drain more easily.  Use a humidifier.  Inhale steam 3 to 4 times a day (for example, sit in the bathroom with the shower running).  Apply a warm,  moist washcloth to your face 3 to 4 times a day, or as directed by your caregiver.  Use saline nasal sprays to help moisten and clean your sinuses.  Take over-the-counter or prescription medicines for pain, discomfort, or fever only as directed by your caregiver. SEEK IMMEDIATE  MEDICAL CARE IF:  You have increasing pain or severe headaches.  You have nausea, vomiting, or drowsiness.  You have swelling around your face.  You have vision problems.  You have a stiff neck.  You have difficulty breathing. MAKE SURE YOU:   Understand these instructions.  Will watch your condition.  Will get help right away if you are not doing well or get worse. Document Released: 02/21/2005 Document Revised: 05/16/2011 Document Reviewed: 03/08/2011 [facility] Patient Information 2014 [place], [place].

## 2018-03-29 ENCOUNTER — Other Ambulatory Visit: Payer: Self-pay

## 2018-03-29 ENCOUNTER — Encounter (HOSPITAL_COMMUNITY)
Admission: RE | Admit: 2018-03-29 | Discharge: 2018-03-29 | Disposition: A | Discharge status: Home / Self Care | Payer: Managed Care, Other (non HMO) | Source: Ambulatory Visit | Attending: Surgery | Admitting: Surgery

## 2018-03-29 ENCOUNTER — Encounter (HOSPITAL_COMMUNITY): Payer: Self-pay

## 2018-03-29 DIAGNOSIS — Z01812 Encounter for preprocedural laboratory examination: Secondary | ICD-10-CM | POA: Diagnosis present

## 2018-03-29 LAB — CBC
HEMATOCRIT: 41 % (ref 39.0–52.0)
Hemoglobin: 13.4 g/dL (ref 13.0–17.0)
MCH: 29.5 pg (ref 26.0–34.0)
MCHC: 32.7 g/dL (ref 30.0–36.0)
MCV: 90.3 fL (ref 80.0–100.0)
NRBC: 0 % (ref 0.0–0.2)
Platelets: 468 10*3/uL — ABNORMAL HIGH (ref 150–400)
RBC: 4.54 MIL/uL (ref 4.22–5.81)
RDW: 13.3 % (ref 11.5–15.5)
WBC: 9.7 10*3/uL (ref 4.0–10.5)

## 2018-03-29 NOTE — Patient Instructions (Signed)
Gregory Webb  03/29/2018   Your procedure is scheduled on: 04/03/2018  Report to [facility] Main  Entrance  Report to admitting at   0900 AM    Call this number if you have problems the morning of surgery [phone]   Remember: Do not eat food or drink liquids :After Midnight. BRUSH YOUR TEETH MORNING OF SURGERY AND RINSE YOUR MOUTH OUT, NO CHEWING GUM CANDY OR MINTS.     Take these medicines the morning of surgery with A SIP OF WATER: Flonase, inhaler if needed, Synthroid, Prilosec                                 You may not have any metal on your body including hair pins and              piercings  Do not wear jewelry, , lotions, powders or perfumes, deodorant                          Men may shave face and neck.   Do not bring valuables to the hospital. [place].  Contacts, dentures or bridgework may not be worn into surgery.       Patients discharged the day of surgery will not be allowed to drive home. IF YOU ARE HAVING SURGERY AND GOING HOME THE SAME DAY, YOU MUST HAVE AN ADULT TO DRIVE YOU HOME AND BE WITH YOU FOR 24 HOURS. YOU MAY GO HOME BY TAXI OR UBER OR ORTHERWISE, BUT AN ADULT MUST ACCOMPANY YOU HOME AND STAY WITH YOU FOR 24 HOURS.  Name and phone number of your driver:  Special Instructions: N/A              Please read over the following fact sheets you were given: _____________________________________________________________________             [facility] - Preparing for Surgery Before surgery, you can play an important role.  Because skin is not sterile, your skin needs to be as free of germs as possible.  You can reduce the number of germs on your skin by washing with CHG (chlorahexidine gluconate) soap before surgery.  CHG is an antiseptic cleaner which kills germs and bonds with the skin to continue killing germs even after washing. Please DO NOT use if you have an allergy to CHG  or antibacterial soaps.  If your skin becomes reddened/irritated stop using the CHG and inform your nurse when you arrive at Short Stay. Do not shave (including legs and underarms) for at least 48 hours prior to the first CHG shower.  You may shave your face/neck. Please follow these instructions carefully:  1.  Shower with CHG Soap the night before surgery and the  morning of Surgery.  2.  If you choose to wash your hair, wash your hair first as usual with your  normal  shampoo.  3.  After you shampoo, rinse your hair and body thoroughly to remove the  shampoo.                           4.  Use CHG as you would any other liquid soap.  You can apply chg directly  to the skin and wash                       Gently with a scrungie or clean washcloth.  5.  Apply the CHG Soap to your body ONLY FROM THE NECK DOWN.   Do not use on face/ open                           Wound or open sores. Avoid contact with eyes, ears mouth and genitals (private parts).                       Wash face,  Genitals (private parts) with your normal soap.             6.  Wash thoroughly, paying special attention to the area where your surgery  will be performed.  7.  Thoroughly rinse your body with warm water from the neck down.  8.  DO NOT shower/wash with your normal soap after using and rinsing off  the CHG Soap.                9.  Pat yourself dry with a clean towel.            10.  Wear clean pajamas.            11.  Place clean sheets on your bed the night of your first shower and do not  sleep with pets. Day of Surgery : Do not apply any lotions/deodorants the morning of surgery.  Please wear clean clothes to the hospital/surgery center.  FAILURE TO FOLLOW THESE INSTRUCTIONS MAY RESULT IN THE CANCELLATION OF YOUR SURGERY PATIENT SIGNATURE_________________________________  NURSE SIGNATURE__________________________________  ________________________________________________________________________

## 2018-03-29 NOTE — Progress Notes (Signed)
Gregory Webb reports saw PCP on 03/28/2018 for sinus infection.  Placed on Augmentin for 7 days.  Temp at preop 99.1.  Gregory Webb voices no other complaints. Called [facility] and spoke with Triage who will send MD a message regarding above.  Gregory Webb is aware and voiced understanding to call if does not improve to PCP and to Dr Hassell Done.

## 2018-04-02 NOTE — H&P (Signed)
Chief Complaint:  Bilateral inguinal herniae R>L  History of Present Illness:  Gregory Webb is an 58 y.o. male referred by Dr. Tresa Endo with bilateral inguinal herniae.  These ache at times but he denies incarceration.  Informed consent regarding lap and open inguinal hernia repairs was obtained in the office.   Past Medical History:  Diagnosis Date  . Adenomatous colon polyp   . Allergic rhinitis   . Asthma    prn Combivent seasonal   . Diverticulosis   . GERD (gastroesophageal reflux disease)   . Hemorrhoids   . Hyperlipidemia 08/2009   LDL 128, HDL 49.40,TG 78. No premature CAD/MI in [place]  . Hypothyroidism   . Low back pain syndrome   . Lower esophageal ring 06/2010   dilated  . Prostatitis    X3; Dr Lawerance Bach    Past Surgical History:  Procedure Laterality Date  . COLONOSCOPY  06/08/2010   1 adenoma, mild diverticulosis and hemorrhoids plan for repeat 2017 approximately  . SHOULDER SURGERY  2008   right  . SHOULDER SURGERY Left 2018  . UPPER GASTROINTESTINAL ENDOSCOPY  06/08/2010   Lower esophageal ring, dilated to 50 [place]. Mild gastritis, thoracic inlet patch.    No current facility-administered medications for this encounter.    Current Outpatient Medications  Medication Sig Dispense Refill  . Ipratropium-Albuterol (COMBIVENT) 20-100 MCG/ACT AERS respimat Please use 1 puff every 4-6 hours as needed. (Patient taking differently: Inhale 1 puff into the lungs every 6 (six) hours as needed for wheezing or shortness of breath. Please use 1 puff every 4-6 hours as needed.) 1 Inhaler 2  . levothyroxine (SYNTHROID, LEVOTHROID) 112 MCG tablet Take 1 tablet (112 mcg total) by mouth daily. 30 tablet 3  . amoxicillin-clavulanate (AUGMENTIN) 875-125 MG tablet Take 1 tablet by mouth 2 (two) times daily. 14 tablet 0  . fluticasone (FLONASE) 50 MCG/ACT nasal spray Place 2 sprays into both nostrils daily. 16 g 6  . omeprazole (PRILOSEC) 20 MG capsule TAKE 1 CAPSULE BY MOUTH DAILY 30  capsule 4   Patient has no known allergies. Family History  Problem Relation Age of Onset  . Breast cancer Mother   . Hyperlipidemia Mother   . Melanoma Mother   . Colon cancer Paternal Grandmother   . Stroke Maternal Grandmother        late 57s  . Heart attack Paternal Grandfather        in 56s  . Stroke Maternal Grandfather        late 74s  . Diabetes Neg Hx    Social History:   reports that he quit smoking about 37 years ago. His smoking use included cigarettes. He has quit using smokeless tobacco. He reports current alcohol use of about 3.0 - 4.0 standard drinks of alcohol per week. He reports that he does not use drugs.   REVIEW OF SYSTEMS : Negative except for problem list  Physical Exam:   There were no vitals taken for this visit. There is no height or weight on file to calculate BMI.  Gen:  WDWN WM NAD  Neurological: Alert and oriented to person, place, and time. Motor and sensory function is grossly intact  Head: Normocephalic and atraumatic.  Eyes: Conjunctivae are normal. Pupils are equal, round, and reactive to light. No scleral icterus.  Neck: Normal range of motion. Neck supple. No tracheal deviation or thyromegaly present.  Cardiovascular:  SR without murmurs or gallops.  No carotid bruits Breast:  Not examined Respiratory:  Effort normal.  No respiratory distress. No chest wall tenderness. Breath sounds normal.  No wheezes, rales or rhonchi.  Abdomen:  nontender GU:  Bilateral inguinal herniae R>L and reducible Musculoskeletal: Normal range of motion. Extremities are nontender. No cyanosis, edema or clubbing noted Lymphadenopathy: No cervical, preauricular, postauricular or axillary adenopathy is present Skin: Skin is warm and dry. No rash noted. No diaphoresis. No erythema. No pallor. Pscyh: Normal mood and affect. Behavior is normal. Judgment and thought content normal.   LABORATORY RESULTS: No results found for this or any previous visit (from the past 48  hour(s)).   RADIOLOGY RESULTS: No results found.  Problem List: Patient Active Problem List   Diagnosis Date Noted  . Physical exam 02/02/2015  . GERD (gastroesophageal reflux disease) 03/19/2013  . BPH (benign prostatic hypertrophy) 10/19/2011  . Hx of adenomatous polyp of colon 08/09/2010  . Hypothyroidism 09/14/2009  . Hyperlipidemia 09/14/2009  . ALLERGIC RHINITIS 08/17/2009    Assessment & Plan: Bilateral inguinal herniae for lap TEP repair.     Matt B. Hassell Done, MD, [facility] [phone] beeper [phone]  04/02/2018 10:00 PM

## 2018-04-03 ENCOUNTER — Encounter (HOSPITAL_COMMUNITY)
Admission: RE | Disposition: A | Discharge status: Home / Self Care | Payer: Self-pay | Source: Home / Self Care | Attending: Surgery

## 2018-04-03 ENCOUNTER — Ambulatory Visit (HOSPITAL_COMMUNITY): Payer: Managed Care, Other (non HMO) | Admitting: Anesthesiology

## 2018-04-03 ENCOUNTER — Ambulatory Visit (HOSPITAL_COMMUNITY)
Admission: RE | Admit: 2018-04-03 | Discharge: 2018-04-03 | Disposition: A | Discharge status: Home / Self Care | Payer: Managed Care, Other (non HMO) | Attending: Surgery | Admitting: Surgery

## 2018-04-03 ENCOUNTER — Encounter (HOSPITAL_COMMUNITY): Payer: Self-pay

## 2018-04-03 ENCOUNTER — Ambulatory Visit (HOSPITAL_COMMUNITY): Payer: Managed Care, Other (non HMO) | Admitting: Physician Assistant

## 2018-04-03 DIAGNOSIS — K4020 Bilateral inguinal hernia, without obstruction or gangrene, not specified as recurrent: Secondary | ICD-10-CM | POA: Diagnosis not present

## 2018-04-03 DIAGNOSIS — Z79890 Hormone replacement therapy (postmenopausal): Secondary | ICD-10-CM | POA: Insufficient documentation

## 2018-04-03 DIAGNOSIS — K219 Gastro-esophageal reflux disease without esophagitis: Secondary | ICD-10-CM | POA: Insufficient documentation

## 2018-04-03 DIAGNOSIS — Z7951 Long term (current) use of inhaled steroids: Secondary | ICD-10-CM | POA: Insufficient documentation

## 2018-04-03 DIAGNOSIS — Z86010 Personal history of colonic polyps: Secondary | ICD-10-CM | POA: Insufficient documentation

## 2018-04-03 DIAGNOSIS — J45909 Unspecified asthma, uncomplicated: Secondary | ICD-10-CM | POA: Insufficient documentation

## 2018-04-03 DIAGNOSIS — Z87891 Personal history of nicotine dependence: Secondary | ICD-10-CM | POA: Diagnosis not present

## 2018-04-03 DIAGNOSIS — E039 Hypothyroidism, unspecified: Secondary | ICD-10-CM | POA: Insufficient documentation

## 2018-04-03 DIAGNOSIS — Z79899 Other long term (current) drug therapy: Secondary | ICD-10-CM | POA: Insufficient documentation

## 2018-04-03 HISTORY — PX: INGUINAL HERNIA REPAIR: SHX194

## 2018-04-03 SURGERY — REPAIR, HERNIA, INGUINAL, BILATERAL, LAPAROSCOPIC
Anesthesia: General | Laterality: Bilateral

## 2018-04-03 MED ORDER — ONDANSETRON HCL 4 MG/2ML IJ SOLN
INTRAMUSCULAR | Status: AC
  Filled 2018-04-03: qty 2

## 2018-04-03 MED ORDER — GLYCOPYRROLATE 0.2 MG/ML IJ SOLN
INTRAMUSCULAR | Status: DC | PRN
  Administered 2018-04-03: 0.2 mg via INTRAVENOUS

## 2018-04-03 MED ORDER — KETOROLAC TROMETHAMINE 30 MG/ML IJ SOLN
INTRAMUSCULAR | Status: AC
  Filled 2018-04-03: qty 1

## 2018-04-03 MED ORDER — GABAPENTIN 300 MG PO CAPS
300.0000 mg | ORAL_CAPSULE | ORAL | Status: AC
  Administered 2018-04-03: 300 mg via ORAL
  Filled 2018-04-03: qty 1

## 2018-04-03 MED ORDER — HEPARIN SODIUM (PORCINE) 5000 UNIT/ML IJ SOLN
5000.0000 [IU] | Freq: Once | INTRAMUSCULAR | Status: AC
  Administered 2018-04-03: 5000 [IU] via SUBCUTANEOUS
  Filled 2018-04-03: qty 1

## 2018-04-03 MED ORDER — PROPOFOL 10 MG/ML IV BOLUS
INTRAVENOUS | Status: AC
  Filled 2018-04-03: qty 20

## 2018-04-03 MED ORDER — FENTANYL CITRATE (PF) 100 MCG/2ML IJ SOLN
25.0000 ug | INTRAMUSCULAR | Status: DC | PRN
  Administered 2018-04-03: 25 ug via INTRAVENOUS

## 2018-04-03 MED ORDER — SODIUM CHLORIDE (PF) 0.9 % IJ SOLN
INTRAMUSCULAR | Status: DC | PRN
  Administered 2018-04-03: 10 mL

## 2018-04-03 MED ORDER — CHLORHEXIDINE GLUCONATE CLOTH 2 % EX PADS: 6.0000 | MEDICATED_PAD | Freq: Once | CUTANEOUS | Status: DC

## 2018-04-03 MED ORDER — MIDAZOLAM HCL 5 MG/5ML IJ SOLN
INTRAMUSCULAR | Status: DC | PRN
  Administered 2018-04-03: 2 mg via INTRAVENOUS

## 2018-04-03 MED ORDER — MIDAZOLAM HCL 2 MG/2ML IJ SOLN
INTRAMUSCULAR | Status: AC
  Filled 2018-04-03: qty 2

## 2018-04-03 MED ORDER — BUPIVACAINE LIPOSOME 1.3 % IJ SUSP
20.0000 mL | Freq: Once | INTRAMUSCULAR | Status: DC
  Filled 2018-04-03: qty 20

## 2018-04-03 MED ORDER — DEXAMETHASONE SODIUM PHOSPHATE 10 MG/ML IJ SOLN
INTRAMUSCULAR | Status: DC | PRN
  Administered 2018-04-03: 10 mg via INTRAVENOUS

## 2018-04-03 MED ORDER — FENTANYL CITRATE (PF) 100 MCG/2ML IJ SOLN
INTRAMUSCULAR | Status: DC | PRN
  Administered 2018-04-03: 100 ug via INTRAVENOUS
  Administered 2018-04-03: 25 ug via INTRAVENOUS
  Administered 2018-04-03 (×2): 50 ug via INTRAVENOUS
  Administered 2018-04-03: 25 ug via INTRAVENOUS

## 2018-04-03 MED ORDER — KETOROLAC TROMETHAMINE 30 MG/ML IJ SOLN
30.0000 mg | Freq: Once | INTRAMUSCULAR | Status: AC | PRN
  Administered 2018-04-03: 30 mg via INTRAVENOUS

## 2018-04-03 MED ORDER — ACETAMINOPHEN 500 MG PO TABS
1000.0000 mg | ORAL_TABLET | ORAL | Status: AC
  Administered 2018-04-03: 1000 mg via ORAL
  Filled 2018-04-03: qty 2

## 2018-04-03 MED ORDER — EPHEDRINE SULFATE 50 MG/ML IJ SOLN
INTRAMUSCULAR | Status: DC | PRN
  Administered 2018-04-03 (×2): 10 mg via INTRAVENOUS

## 2018-04-03 MED ORDER — LIDOCAINE HCL 2 % IJ SOLN
INTRAMUSCULAR | Status: AC
  Filled 2018-04-03: qty 20

## 2018-04-03 MED ORDER — HYDROCODONE-ACETAMINOPHEN 5-325 MG PO TABS: 1.0000 | ORAL_TABLET | Freq: Four times a day (QID) | ORAL | 0 refills | Status: DC | PRN

## 2018-04-03 MED ORDER — CEFAZOLIN SODIUM-DEXTROSE 2-4 GM/100ML-% IV SOLN
2.0000 g | INTRAVENOUS | Status: AC
  Administered 2018-04-03: 2 g via INTRAVENOUS
  Filled 2018-04-03: qty 100

## 2018-04-03 MED ORDER — BUPIVACAINE LIPOSOME 1.3 % IJ SUSP
INTRAMUSCULAR | Status: DC | PRN
  Administered 2018-04-03: 20 mL

## 2018-04-03 MED ORDER — ONDANSETRON HCL 4 MG/2ML IJ SOLN
INTRAMUSCULAR | Status: DC | PRN
  Administered 2018-04-03: 4 mg via INTRAVENOUS

## 2018-04-03 MED ORDER — SUGAMMADEX SODIUM 200 MG/2ML IV SOLN
INTRAVENOUS | Status: DC | PRN
  Administered 2018-04-03: 200 mg via INTRAVENOUS

## 2018-04-03 MED ORDER — LIDOCAINE 20MG/ML (2%) 15 ML SYRINGE OPTIME
INTRAMUSCULAR | Status: DC | PRN
  Administered 2018-04-03: 1.5 mg/kg/h via INTRAVENOUS

## 2018-04-03 MED ORDER — 0.9 % SODIUM CHLORIDE (POUR BTL) OPTIME
TOPICAL | Status: DC | PRN
  Administered 2018-04-03: 1000 mL

## 2018-04-03 MED ORDER — KETAMINE HCL 10 MG/ML IJ SOLN
INTRAMUSCULAR | Status: DC | PRN
  Administered 2018-04-03: 40 mg via INTRAVENOUS

## 2018-04-03 MED ORDER — DEXAMETHASONE SODIUM PHOSPHATE 10 MG/ML IJ SOLN
INTRAMUSCULAR | Status: AC
  Filled 2018-04-03: qty 1

## 2018-04-03 MED ORDER — ROCURONIUM BROMIDE 100 MG/10ML IV SOLN
INTRAVENOUS | Status: DC | PRN
  Administered 2018-04-03 (×2): 10 mg via INTRAVENOUS
  Administered 2018-04-03: 50 mg via INTRAVENOUS

## 2018-04-03 MED ORDER — LACTATED RINGERS IV SOLN
INTRAVENOUS | Status: DC
  Administered 2018-04-03 (×2): via INTRAVENOUS

## 2018-04-03 MED ORDER — PROPOFOL 10 MG/ML IV BOLUS
INTRAVENOUS | Status: DC | PRN
  Administered 2018-04-03: 150 mg via INTRAVENOUS

## 2018-04-03 MED ORDER — OXYCODONE HCL 5 MG/5ML PO SOLN
5.0000 mg | Freq: Once | ORAL | Status: DC | PRN
  Filled 2018-04-03: qty 5

## 2018-04-03 MED ORDER — FENTANYL CITRATE (PF) 100 MCG/2ML IJ SOLN
INTRAMUSCULAR | Status: AC
  Filled 2018-04-03: qty 2

## 2018-04-03 MED ORDER — SUGAMMADEX SODIUM 200 MG/2ML IV SOLN
INTRAVENOUS | Status: AC
  Filled 2018-04-03: qty 2

## 2018-04-03 MED ORDER — OXYCODONE HCL 5 MG PO TABS: 5.0000 mg | ORAL_TABLET | Freq: Once | ORAL | Status: DC | PRN

## 2018-04-03 MED ORDER — SODIUM CHLORIDE (PF) 0.9 % IJ SOLN
INTRAMUSCULAR | Status: AC
  Filled 2018-04-03: qty 10

## 2018-04-03 MED ORDER — KETAMINE HCL 10 MG/ML IJ SOLN
INTRAMUSCULAR | Status: AC
  Filled 2018-04-03: qty 1

## 2018-04-03 MED ORDER — FENTANYL CITRATE (PF) 250 MCG/5ML IJ SOLN
INTRAMUSCULAR | Status: AC
  Filled 2018-04-03: qty 5

## 2018-04-03 MED ORDER — PROMETHAZINE HCL 25 MG/ML IJ SOLN: 6.2500 mg | INTRAMUSCULAR | Status: DC | PRN

## 2018-04-03 MED ORDER — ROCURONIUM BROMIDE 100 MG/10ML IV SOLN
INTRAVENOUS | Status: AC
  Filled 2018-04-03: qty 1

## 2018-04-03 SURGICAL SUPPLY — 38 items
ADH SKN CLS APL DERMABOND .7 (GAUZE/BANDAGES/DRESSINGS) ×1
APL SKNCLS STERI-STRIP NONHPOA (GAUZE/BANDAGES/DRESSINGS) ×1
BENZOIN TINCTURE PRP APPL 2/3 (GAUZE/BANDAGES/DRESSINGS) ×2 IMPLANT
CABLE HIGH FREQUENCY MONO STRZ (ELECTRODE) ×2 IMPLANT
COVER SURGICAL LIGHT HANDLE (MISCELLANEOUS) ×2 IMPLANT
COVER WAND RF STERILE (DRAPES) ×1 IMPLANT
DECANTER SPIKE VIAL GLASS SM (MISCELLANEOUS) ×2 IMPLANT
DERMABOND ADVANCED (GAUZE/BANDAGES/DRESSINGS) ×1
DERMABOND ADVANCED .7 DNX12 (GAUZE/BANDAGES/DRESSINGS) ×1 IMPLANT
DISSECT BALLN SPACEMKR + OVL (BALLOONS) ×2
DISSECTOR BALLN SPACEMKR + OVL (BALLOONS) ×1 IMPLANT
DISSECTOR BLUNT TIP ENDO 5MM (MISCELLANEOUS) IMPLANT
ELECT PENCIL ROCKER SW 15FT (MISCELLANEOUS) ×2 IMPLANT
ELECT REM PT RETURN 15FT ADLT (MISCELLANEOUS) ×2 IMPLANT
GLOVE BIOGEL M 8.0 STRL (GLOVE) ×2 IMPLANT
GOWN SPEC L4 XLG W/TWL (GOWN DISPOSABLE) ×2 IMPLANT
GOWN STRL REUS W/TWL XL LVL3 (GOWN DISPOSABLE) ×6 IMPLANT
KIT BASIN OR (CUSTOM PROCEDURE TRAY) ×2 IMPLANT
MARKER SKIN DUAL TIP RULER LAB (MISCELLANEOUS) ×2 IMPLANT
MESH 3DMAX 4X6 LT LRG (Mesh General) ×1 IMPLANT
MESH 3DMAX 5X7 RT XLRG (Mesh General) ×1 IMPLANT
PAD POSITIONING PINK XL (MISCELLANEOUS) IMPLANT
PROTECTOR NERVE ULNAR (MISCELLANEOUS) IMPLANT
SCISSORS LAP 5X35 DISP (ENDOMECHANICALS) IMPLANT
SET IRRIG TUBING LAPAROSCOPIC (IRRIGATION / IRRIGATOR) IMPLANT
SET TUBE SMOKE EVAC HIGH FLOW (TUBING) ×2 IMPLANT
SLEEVE XCEL OPT CAN 5 100 (ENDOMECHANICALS) ×2 IMPLANT
SOLUTION ANTI FOG 6CC (MISCELLANEOUS) ×2 IMPLANT
STRIP CLOSURE SKIN 1/2X4 (GAUZE/BANDAGES/DRESSINGS) IMPLANT
SUT MNCRL AB 4-0 PS2 18 (SUTURE) ×2 IMPLANT
SUT VIC AB 4-0 SH 18 (SUTURE) ×2 IMPLANT
SUT VICRYL 0 UR6 27IN ABS (SUTURE) ×1 IMPLANT
TACKER 5MM HERNIA 3.5CML NAB (ENDOMECHANICALS) ×2 IMPLANT
TAPE CLOTH 4X10 WHT NS (GAUZE/BANDAGES/DRESSINGS) IMPLANT
TOWEL OR NON WOVEN STRL DISP B (DISPOSABLE) ×2 IMPLANT
TRAY FOLEY MTR SLVR 16FR STAT (SET/KITS/TRAYS/PACK) ×2 IMPLANT
TRAY LAPAROSCOPIC (CUSTOM PROCEDURE TRAY) ×2 IMPLANT
TROCAR BLADELESS OPT 5 100 (ENDOMECHANICALS) ×1 IMPLANT

## 2018-04-03 NOTE — Anesthesia Preprocedure Evaluation (Signed)
Anesthesia Evaluation  Patient identified by MRN, date of birth, ID band Patient awake    Reviewed: Allergy & Precautions, NPO status , Patient's Chart, lab work & pertinent test results  Airway Mallampati: II  TM Distance: >3 FB Neck ROM: Full    Dental no notable dental hx.    Pulmonary asthma , former smoker,    Pulmonary exam normal breath sounds clear to auscultation       Cardiovascular negative cardio ROS Normal cardiovascular exam Rhythm:Regular Rate:Normal     Neuro/Psych negative neurological ROS  negative psych ROS   GI/Hepatic Neg liver ROS, GERD  ,  Endo/Other  Hypothyroidism   Renal/GU negative Renal ROS  negative genitourinary   Musculoskeletal negative musculoskeletal ROS (+)   Abdominal   Peds negative pediatric ROS (+)  Hematology negative hematology ROS (+)   Anesthesia Other Findings   Reproductive/Obstetrics negative OB ROS                             Anesthesia Physical Anesthesia Plan  ASA: II  Anesthesia Plan: General   Post-op Pain Management:    Induction: Intravenous  PONV Risk Score and Plan: 2 and Ondansetron, Dexamethasone and Treatment may vary due to age or medical condition  Airway Management Planned: Oral ETT  Additional Equipment:   Intra-op Plan:   Post-operative Plan: Extubation in OR  Informed Consent: I have reviewed the patients History and Physical, chart, labs and discussed the procedure including the risks, benefits and alternatives for the proposed anesthesia with the patient or authorized representative who has indicated his/her understanding and acceptance.     Dental advisory given  Plan Discussed with: CRNA and Surgeon  Anesthesia Plan Comments:         Anesthesia Quick Evaluation

## 2018-04-03 NOTE — Anesthesia Procedure Notes (Signed)

## 2018-04-03 NOTE — Discharge Instructions (Signed)
Laparoscopic Inguinal Hernia Repair, Adult, Care After  This sheet gives you information about how to care for yourself after your procedure. Your health care provider may also give you more specific instructions. If you have problems or questions, contact your health care provider.  What can I expect after the procedure?  After the procedure, it is common to have:  · Pain.  · Swelling and bruising around the incision area.  · Scrotal swelling, in men.  · Some fluid or blood draining from your incisions.  Follow these instructions at home:  Incision care  · Follow instructions from your health care provider about how to take care of your incisions. Make sure you:  ? Wash your hands with soap and water before you change your bandage (dressing). If soap and water are not available, use hand sanitizer.  ? Change your dressing as told by your health care provider.  ? Leave stitches (sutures), skin glue, or adhesive strips in place. These skin closures may need to stay in place for 2 weeks or longer. If adhesive strip edges start to loosen and curl up, you may trim the loose edges. Do not remove adhesive strips completely unless your health care provider tells you to do that.  · Check your incision area every day for signs of infection. Check for:  ? More redness, swelling, or pain.  ? More fluid or blood.  ? Warmth.  ? Pus or a bad smell.  · Wear loose, soft clothing while your incisions heal.  Driving  · Do not drive or use heavy machinery while taking prescription pain medicine.  · Do not drive for 24 hours if you were given a medicine to help you relax (sedative) during your procedure.  Activity  · Do not lift anything that is heavier than 10 lb (4.5 kg), or the limit that you are told, until your health care provider says that it is safe.  · Ask your health care provider what activities are safe for you. A lot of activity during the first week after surgery can increase pain and swelling. For 1 week after your  procedure:  ? Avoid activities that take a lot of effort, such as exercise or sports.  ? You may walk and climb stairs as needed for daily activity, but avoid long walks or climbing stairs for exercise.  Managing pain and swelling    · Put ice on painful or swollen areas:  ? Put ice in a plastic bag.  ? Place a towel between your skin and the bag.  ? Leave the ice on for 20 minutes, 2-3 times a day.  General instructions  · Do not take baths, swim, or use a hot tub until your health care provider approves. Ask your health care provider if you may take showers. You may only be allowed to take sponge baths.  · Take over-the-counter and prescription medicines only as told by your health care provider.  · To prevent or treat constipation while you are taking prescription pain medicine, your health care provider may recommend that you:  ? Drink enough fluid to keep your urine pale yellow.  ? Take over-the-counter or prescription medicines.  ? Eat foods that are high in fiber, such as fresh fruits and vegetables, whole grains, and beans.  ? Limit foods that are high in fat and processed sugars, such as fried and sweet foods.  · Do not use any products that contain nicotine or tobacco, such as cigarettes and e-cigarettes. If   you need help quitting, ask your health care provider.  · Drink enough fluid to keep your urine pale yellow.  · Keep all follow-up visits as told by your health care provider. This is important.  Contact a health care provider if:  · You have more redness, swelling, or pain around your incisions or your groin area.  · You have more swelling in your scrotum.  · You have more fluid or blood coming from your incisions.  · Your incisions feel warm to the touch.  · You have severe pain and medicines do not help.  · You have abdominal pain or swelling.  · You cannot eat or drink without vomiting.  · You cannot urinate or pass a bowel movement.  · You faint.  · You feel dizzy.  · You have nausea and  vomiting.  · You have a fever.  Get help right away if:  · You have pus or a bad smell coming from your incisions.  · You have chest pain.  · You have problems breathing.  Summary  · Pain, swelling, and bruising are common after the procedure.  · Check your incision area every day for signs of infection, such as more redness, swelling, or pain.  · Put ice on painful or swollen areas for 20 minutes, 2-3 times a day.  This information is not intended to replace advice given to you by your health care provider. Make sure you discuss any questions you have with your health care provider.  Document Released: 06/02/2016 Document Revised: 06/02/2016 Document Reviewed: 06/02/2016  Elsevier Interactive Patient Education © 2019 Elsevier Inc.

## 2018-04-03 NOTE — Transfer of Care (Signed)
Immediate Anesthesia Transfer of Care Note  Patient: Gregory Webb  Procedure(s) Performed: LAPAROSCOPIC BILATERAL INGUINAL HERNIA REPAIR WITH MESH ERAS PATHWAY (Bilateral )  Patient Location: PACU  Anesthesia Type:General  Level of Consciousness: awake, alert  and oriented  Airway & Oxygen Therapy: Patient Spontanous Breathing and Patient connected to face mask oxygen  Post-op Assessment: Report given to RN and Post -op Vital signs reviewed and stable  Post vital signs: Reviewed and stable  Last Vitals:  Vitals Value Taken Time  BP 124/81 04/03/2018  2:54 PM  Temp    Pulse 71 04/03/2018  2:56 PM  Resp 16 04/03/2018  2:56 PM  SpO2 100 % 04/03/2018  2:56 PM  Vitals shown include unvalidated device data.  Last Pain:  Vitals:   04/03/18 0905  TempSrc:   PainSc: 0-No pain         Complications: No apparent anesthesia complications

## 2018-04-03 NOTE — Op Note (Signed)
DOLAN XIA  02/21/1961 03 Apr 2018    PCP:  Midge Minium, MD   Surgeon: Kaylyn Lim, MD, FACS  Asst:  none  Anes:  general  Preop Dx: Bilateral inguinal hernia Postop Dx: Bilateral direct inguinal hernia R>L  Procedure: TEP lap bilateral inguinal herniae with 3D Max mesh :  Extra large on the right and Large on the left Location Surgery: [facility] 2 Complications: None noted  EBL:   minimal cc  Drains: none  Description of Procedure:  The patient was taken to OR 2 .  After anesthesia was administered and the patient was prepped  with Technicare and a timeout was performed.  A Foley was in place and the perineum had been prepped as well.  A transverse incision was made below the umbilicus and a preperitoneal dissection was performed in the space maker balloon inserted.  With a 0 degree scope and this balloon I inflated and got a good dissection and could see bilateral hernia sacs which subsequently were palpated the bilateral direct inguinal hernias the right being greater than the left.  In the dissection there was a pneumoperitoneum created which I treated with placement 5 mm trocar in the upper abdomen to the right of the of the umbilicus.  Also look then when I placed this and could see a lot of the preperitoneal gas.  I dissected the inguinal structures on the right and left and identified the large direct hernias.  I then got a piece of 3D max mesh extra-large on the right in place that to cover the direct defect initially anchoring it a little too low and then pulling it out and re-tacking it with the circular permanence tacker and then passing it laterally tacking it to the fascia laterally.  The left side was treated with a large 3D max mesh and it too was overlapped a little bit in the midline intact anteriorly over the defect.  In completion I felt like I had good coverage of both defects.  No bleeding was noted.  The peritoneum was reinspected and a 5 mm trocar was removed  and the preperitoneal space was also relieved and the pneumo was evacuated.  The Dishon dissection port and the fascia was closed with figure-of-eight suture of 2-0 Vicryl and then the ports were all injected with Exparel and closed with 4-0 Monocryl and Dermabond.  The patient tolerated the procedure well and was taken to the PACU in stable condition.     Matt B. Hassell Done, [place], [facility], [place]

## 2018-04-03 NOTE — Interval H&P Note (Signed)
History and Physical Interval Note:  04/03/2018 12:09 PM  Gregory Webb  has presented today for surgery, with the diagnosis of bilateral inguinal hernia  The various methods of treatment have been discussed with the patient and family. After consideration of risks, benefits and other options for treatment, the patient has consented to  Procedure(s): [place] (Bilateral) as a surgical intervention .  The patient's history has been reviewed, patient examined, no change in status, stable for surgery.  I have reviewed the patient's chart and labs.  Questions were answered to the patient's satisfaction.     Pedro Earls

## 2018-04-04 NOTE — Anesthesia Postprocedure Evaluation (Signed)
Anesthesia Post Note  Patient: Gregory Webb  Procedure(s) Performed: LAPAROSCOPIC BILATERAL INGUINAL HERNIA REPAIR WITH MESH ERAS PATHWAY (Bilateral )     Patient location during evaluation: PACU Anesthesia Type: General Level of consciousness: awake and alert Pain management: pain level controlled Vital Signs Assessment: post-procedure vital signs reviewed and stable Respiratory status: spontaneous breathing, nonlabored ventilation, respiratory function stable and patient connected to nasal cannula oxygen Cardiovascular status: blood pressure returned to baseline and stable Postop Assessment: no apparent nausea or vomiting Anesthetic complications: no    Last Vitals:  Vitals:   04/03/18 1605 04/03/18 1745  BP: 128/80 126/76  Pulse: (!) 56 (!) 50  Resp: 14 14  Temp: (!) 36.4 C   SpO2: 95% 100%    Last Pain:  Vitals:   04/03/18 1745  TempSrc:   PainSc: 3                  Derrek Puff S

## 2018-04-06 ENCOUNTER — Encounter (HOSPITAL_COMMUNITY): Payer: Self-pay | Admitting: Surgery

## 2018-05-11 ENCOUNTER — Other Ambulatory Visit: Payer: Self-pay | Admitting: Family Medicine

## 2018-05-11 MED ORDER — LEVOTHYROXINE SODIUM 112 MCG PO TABS: 112.0000 ug | ORAL_TABLET | Freq: Every day | ORAL | 3 refills | Status: DC

## 2018-05-11 NOTE — Addendum Note (Signed)
Addended by: Katina Dung on: 05/11/2018 12:18 PM   Modules accepted: Orders

## 2018-05-11 NOTE — Telephone Encounter (Signed)
Copied from [place] [id number]. Topic: Quick Communication - Rx Refill/Question >> May 11, 2018 12:05 PM Leward Quan A wrote: Medication: levothyroxine (SYNTHROID, LEVOTHROID) 112 MCG tablet   Has the patient contacted their pharmacy? Yes.   (Agent: If no, request that the patient contact the pharmacy for the refill.) (Agent: If yes, when and what did the pharmacy advise?)  Preferred Pharmacy (with phone number or street name): [facility] [place], [place] - [address] [phone] (Phone) [phone] (Fax)    Agent: Please be advised that RX refills may take up to 3 business days. We ask that you follow-up with your pharmacy.

## 2018-07-13 ENCOUNTER — Ambulatory Visit (INDEPENDENT_AMBULATORY_CARE_PROVIDER_SITE_OTHER): Payer: Managed Care, Other (non HMO) | Admitting: Physician Assistant

## 2018-07-13 ENCOUNTER — Other Ambulatory Visit: Payer: Self-pay

## 2018-07-13 ENCOUNTER — Encounter: Payer: Self-pay | Admitting: Physician Assistant

## 2018-07-13 DIAGNOSIS — M545 Low back pain, unspecified: Secondary | ICD-10-CM

## 2018-07-13 DIAGNOSIS — R1031 Right lower quadrant pain: Secondary | ICD-10-CM

## 2018-07-13 DIAGNOSIS — G8929 Other chronic pain: Secondary | ICD-10-CM

## 2018-07-13 LAB — POCT URINALYSIS DIPSTICK
Bilirubin, UA: NEGATIVE
Blood, UA: NEGATIVE
Glucose, UA: NEGATIVE
Ketones, UA: NEGATIVE
Leukocytes, UA: NEGATIVE
Nitrite, UA: NEGATIVE
Protein, UA: NEGATIVE
Spec Grav, UA: 1.02 (ref 1.010–1.025)
Urobilinogen, UA: 0.2 E.U./dL
pH, UA: 6 (ref 5.0–8.0)

## 2018-07-13 MED ORDER — MELOXICAM 15 MG PO TABS: 15.0000 mg | ORAL_TABLET | Freq: Every day | ORAL | 0 refills | Status: DC

## 2018-07-13 NOTE — Patient Instructions (Signed)
Please take the Meloxicam once daily as directed with food. Tylenol for breakthrough pain. No heavy lifting.  We are getting you set up for imaging to further assess. This will likely be Monday before we can get done.  Er for any acutely worsening symptoms over the weekend.

## 2018-07-13 NOTE — Progress Notes (Signed)
Virtual Visit via Video   I connected with patient on 07/16/18 at  1:20 PM EDT by a video enabled telemedicine application and verified that I am speaking with the correct person using two identifiers.  Location patient: Home Location provider: [facility], Office Persons participating in the virtual visit: Patient, Provider, [place] (Patina Moore)  I discussed the limitations of evaluation and management by telemedicine and the availability of in person appointments. The patient expressed understanding and agreed to proceed.  Subjective:   HPI:   Patient presents via D[facility]Me today c/o pain in R-sided inguinal discomfort with radation occasionally to right lower back. Notes symptom onset 7 weeks ago. Denies any trauma or injury prior to onset of symptoms.  Did have hernia repair in early 6160 without complication. Notes pain is intermittent and not changed by ROM. Denies change it urinary habits. Denies radiation into the testicles. Denies change in bowel habits. Was seen at UC 4 weeks ago and though potentially muscular. Was given prednisone and muscle relaxants without improvement in symptoms. Pain is consistently 8/10 when present.   ROS:   See pertinent positives and negatives per HPI.  Patient Active Problem List   Diagnosis Date Noted  . Physical exam 02/02/2015  . GERD (gastroesophageal reflux disease) 03/19/2013  . BPH (benign prostatic hypertrophy) 10/19/2011  . Hx of adenomatous polyp of colon 08/09/2010  . Hypothyroidism 09/14/2009  . Hyperlipidemia 09/14/2009  . ALLERGIC RHINITIS 08/17/2009    Social History   Tobacco Use  . Smoking status: Former Smoker    Types: Cigarettes    Last attempt to quit: 06/06/1980    Years since quitting: 38.1  . Smokeless tobacco: Former Systems developer  . Tobacco comment: smoked [age] , up to 1 pp WEEK  Substance Use Topics  . Alcohol use: Yes    Alcohol/week: 3.0 - 4.0 standard drinks    Types: 3 - 4 Standard drinks or equivalent per  week    Comment: 2-3 beers per week     Current Outpatient Medications:  .  fluticasone (FLONASE) 50 MCG/ACT nasal spray, Place 2 sprays into both nostrils daily., Disp: 16 g, Rfl: 6 .  Ipratropium-Albuterol (COMBIVENT) 20-100 MCG/ACT AERS respimat, Please use 1 puff every 4-6 hours as needed. (Patient taking differently: Inhale 1 puff into the lungs every 6 (six) hours as needed for wheezing or shortness of breath. Please use 1 puff every 4-6 hours as needed.), Disp: 1 Inhaler, Rfl: 2 .  levothyroxine (SYNTHROID, LEVOTHROID) 112 MCG tablet, Take 1 tablet (112 mcg total) by mouth daily., Disp: 30 tablet, Rfl: 3 .  omeprazole (PRILOSEC) 20 MG capsule, TAKE 1 CAPSULE BY MOUTH DAILY, Disp: 30 capsule, Rfl: 4 .  meloxicam (MOBIC) 15 MG tablet, Take 1 tablet (15 mg total) by mouth daily., Disp: 15 tablet, Rfl: 0  No Known Allergies  Objective:   There were no vitals taken for this visit.  Patient is well-developed, well-nourished in no acute distress.  Resting comfortably at home.  Head is normocephalic, atraumatic.  No labored breathing.  Speech is clear and coherent with logical content.  Patient is alert and oriented at baseline.  BS normal in all 4Qs. There is some tenderness in RLQ and side with deeper palpation. Negative Mcburney or Psoas signs.  Negative RUQ pain or Murphy sign. Negative CVA tenderness. ROM of torso within normal limits without reproducing pain.  Assessment and Plan:   1. Low back pain, unspecified back pain laterality, unspecified chronicity, unspecified whether sciatica present -  POCT Urinalysis Dipstick 2. Chronic RLQ pain - CT ABDOMEN PELVIS WO CONTRAST; Future  Patient initially assessed via video but was brought in for urine testing at which time I performed a physical examination.   Unclear etiology. Did have recent hernia repair so surgical adhesions could be culprit. Giving pain in back and side that is intermittent with inability to get comfortable, a  renal stone cannot be ruled out. Mild diverticular disease also a possibility. Giving chronicity and worsening pain despite prior treatments, will need further assessment. UA unremarkable. CT abd/pelvis ordered for further assessment.    Leeanne Rio, PA-C 07/16/2018

## 2018-07-13 NOTE — Progress Notes (Signed)
I have discussed the procedure for the virtual visit with the patient who has given consent to proceed with assessment and treatment.   Gregory Webb, CMA     

## 2018-07-18 ENCOUNTER — Telehealth: Payer: Self-pay | Admitting: Family Medicine

## 2018-07-18 ENCOUNTER — Other Ambulatory Visit: Payer: Self-pay

## 2018-07-18 ENCOUNTER — Other Ambulatory Visit: Payer: Self-pay | Admitting: Internal Medicine

## 2018-07-18 DIAGNOSIS — R140 Abdominal distension (gaseous): Secondary | ICD-10-CM

## 2018-07-18 DIAGNOSIS — N281 Cyst of kidney, acquired: Secondary | ICD-10-CM

## 2018-07-18 MED ORDER — LACTULOSE 20 GM/30ML PO SOLN: ORAL | 3 refills | Status: DC

## 2018-07-18 NOTE — Telephone Encounter (Signed)
CT scan has been scheduled 

## 2018-07-18 NOTE — Telephone Encounter (Signed)
Dr. Derrel Nip, on call physician called and says she has reviewed the CT abdomen/pelvis results, she asked if I would call the patient to let him know the report indicates no stone, no appendicitis, no diverticulitis, moderate amount of stool burden, so it's possibly constipation causing the abdominal pain. She says to let him know he has a cyst on the right kidney and that is not causing the pain. She says to let him know she will send in Lactulose to treat the constipation and it should produce a BM in 3-4 hours. She says to let him know that Dr. Birdie Riddle will decide any further testing for the cyst, if she chooses to. I called the patient and advised of the above by Dr. Derrel Nip, he verbalized understanding. He says he will wait on a call from Dr. Birdie Riddle to follow up on the cysts.

## 2018-07-18 NOTE — Telephone Encounter (Signed)
Copied from [place] [id number]. Topic: General - Inquiry >> Jul 17, 2018  3:44 PM Richardo Priest, NT wrote: Reason for CRM: Patient is calling checking on status for CT appointment and order. Call back is [phone] >> Jul 18, 2018  8:46 AM Wynell Balloon wrote: Made pt aware that we have sent the records to the insurance company and at this point we are waiting for an approval from them.  >> Jul 18, 2018 11:49 AM Valla Leaver wrote: Rep, Janette, with [employer], says the  MNOT#[id number] Effective 07/13/2018-01/09/2019. Order needs to be sent to [place] [facility] and their fax is FAX:[phone]

## 2018-07-18 NOTE — Telephone Encounter (Deleted)
Copied from [place] [phone]. Topic: General - Inquiry >> Jul 17, 2018  3:44 PM Richardo Priest, NT wrote: Reason for CRM: Patient is calling checking on status for CT appointment and order. Call back is [phone] >> Jul 18, 2018  8:46 AM Wynell Balloon wrote: Made pt aware that we have sent the records to the insurance company and at this point we are waiting for an approval from them.

## 2018-07-18 NOTE — Telephone Encounter (Signed)
Rep, Janette, with [employer], says the  VXBL#[id number] Effective 07/13/2018-01/09/2019. Order needs to be sent to [place] and their fax is FAX:[phone]

## 2018-07-18 NOTE — Telephone Encounter (Signed)
Report of CT Abdomin and Pelvis without contrast No urgent findings Prostate omegally Mild Non specific peri vesical stranding Recommend correlation with UA to R/O UTI Moderate Stool burdon Septated right renal cyst 4.3 x 4.2 x 4cm Renal US can be preformed to better evaluate the lesion  Fluid attenuating cyst Left kidney  statistically likely benign cyst  Call placed to Dr Derrel Nip on call physician. Left VM to return call to office.

## 2018-07-18 NOTE — Progress Notes (Signed)
CT abd and pelvis results called to me by [facility] after hours.  Report is available and was reviewed through the Epic portal:  No evidence of diverticultis, appendicitis,  Inguinal Hernia, Hydronephrosis or rena calculi .  Peri vesicle stranding noted , but patient had a normal POCT UA on day of evaluation (May 8) .    Moderate stool burden noted:  Called in lactulose to [facility]  Incidental finding of 4 x 4 x 4 renal cyst .  Renal US suggested for further evaluation: patient advised to discuss with Dr Birdie Riddle re additional imaging   Prostate enlargement also noted.   Patient notified of results by Lattie Haw,  The Triage RN for the [facility]

## 2018-07-18 NOTE — Telephone Encounter (Signed)
Copied from [place] [phone]. Topic: General - Inquiry >> Jul 17, 2018  3:44 PM Richardo Priest, NT wrote: Reason for CRM: Patient is calling checking on status for CT appointment and order. Call back is [phone] >> Jul 18, 2018  8:46 AM Wynell Balloon wrote: Made pt aware that we have sent the records to the insurance company and at this point we are waiting for an approval from them.  >> Jul 18, 2018 11:49 AM Valla Leaver wrote: Rep, Janette, with [employer], says the  YVDP#[id number] Effective 07/13/2018-01/09/2019. Order needs to be sent to [place] and their fax is [phone]

## 2018-07-19 NOTE — Telephone Encounter (Signed)
Called and spoke with pt, advised of PCP recommendation to have [place] completed. Pt is in agreement and orders were placed today.

## 2018-07-19 NOTE — Addendum Note (Signed)
Addended by: Davis Gourd on: 07/19/2018 10:44 AM   Modules accepted: Orders

## 2018-07-19 NOTE — Telephone Encounter (Signed)
Pt will need renal US due to presence of R renal cyst on CT scan

## 2018-07-20 ENCOUNTER — Other Ambulatory Visit: Payer: Self-pay

## 2018-07-20 ENCOUNTER — Ambulatory Visit (HOSPITAL_BASED_OUTPATIENT_CLINIC_OR_DEPARTMENT_OTHER)
Admission: RE | Admit: 2018-07-20 | Discharge: 2018-07-20 | Disposition: A | Discharge status: Home / Self Care | Payer: Managed Care, Other (non HMO) | Source: Ambulatory Visit | Attending: Family Medicine | Admitting: Family Medicine

## 2018-07-20 DIAGNOSIS — N281 Cyst of kidney, acquired: Secondary | ICD-10-CM | POA: Insufficient documentation

## 2018-07-23 ENCOUNTER — Telehealth: Payer: Self-pay | Admitting: Family Medicine

## 2018-07-23 NOTE — Telephone Encounter (Signed)
Have you received these results yet?

## 2018-07-23 NOTE — Telephone Encounter (Signed)
Please call pt with Results from [place].   Copied from [place] [phone]. Topic: General - Inquiry >> Jul 20, 2018  4:53 PM Gregory Webb wrote: Reason for CRM: pt called wanting his results of his ultrasound. He would like a call back please

## 2018-07-23 NOTE — Telephone Encounter (Signed)
No results available at this time

## 2018-07-23 NOTE — Telephone Encounter (Signed)
Called and advised pt that we have not received anything yet. He stated an understanding.

## 2018-07-24 ENCOUNTER — Other Ambulatory Visit: Payer: Self-pay | Admitting: Physician Assistant

## 2018-07-24 DIAGNOSIS — N281 Cyst of kidney, acquired: Secondary | ICD-10-CM

## 2018-07-24 DIAGNOSIS — G8929 Other chronic pain: Secondary | ICD-10-CM

## 2018-11-12 ENCOUNTER — Other Ambulatory Visit: Payer: Self-pay | Admitting: Family Medicine

## 2018-11-13 ENCOUNTER — Other Ambulatory Visit: Payer: Self-pay | Admitting: Family Medicine

## 2019-01-22 ENCOUNTER — Encounter: Payer: Self-pay | Admitting: Internal Medicine

## 2019-01-22 ENCOUNTER — Other Ambulatory Visit: Payer: Self-pay

## 2019-01-22 ENCOUNTER — Ambulatory Visit (INDEPENDENT_AMBULATORY_CARE_PROVIDER_SITE_OTHER): Payer: Managed Care, Other (non HMO) | Admitting: Internal Medicine

## 2019-01-22 VITALS — BP 124/72 | HR 86 | Temp 98.4°F | Ht 72.0 in | Wt 191.0 lb

## 2019-01-22 DIAGNOSIS — R151 Fecal smearing: Secondary | ICD-10-CM | POA: Diagnosis not present

## 2019-01-22 DIAGNOSIS — K219 Gastro-esophageal reflux disease without esophagitis: Secondary | ICD-10-CM

## 2019-01-22 DIAGNOSIS — K641 Second degree hemorrhoids: Secondary | ICD-10-CM | POA: Diagnosis not present

## 2019-01-22 MED ORDER — HYDROCORTISONE (PERIANAL) 2.5 % EX CREA: TOPICAL_CREAM | Freq: Every day | CUTANEOUS | 1 refills | Status: DC

## 2019-01-22 NOTE — Patient Instructions (Addendum)
Normal BMI (Body Mass Index- based on height and weight) is between 19 and 25. Your BMI today is Body mass index is 25.9 kg/m. Marland Kitchen Please consider follow up  regarding your BMI with your Primary Care Provider.   Take your omeprazole daily prior to going to work.   We are giving you handouts to read on GERD and hemorrhoids and hemorrhoid banding.  We have sent the following medications to your pharmacy for you to pick up at your convenience: Hydrocortisone cream, if this doesn't improve call us back.   I appreciate the opportunity to care for you. Silvano Rusk, MD, [facility]

## 2019-01-22 NOTE — Assessment & Plan Note (Addendum)
Think these are cause of intermittent fecal smearing Treat with HC cream x 2 weeks and then prn Hold banding in reserve F/U prn

## 2019-01-22 NOTE — Progress Notes (Signed)
Gregory Webb 58 y.o. 1960-11-03 [id number]  Assessment & Plan:   Encounter Diagnoses  Name Primary?  . Gastroesophageal reflux disease without esophagitis Yes  . Fecal smearing   . Prolapsed internal hemorrhoids, grade 2     GERD (gastroesophageal reflux disease) Diet - reduce caffeine/coffee Take omeprazole qd before work (3rd shift)   Prolapsed internal hemorrhoids, grade 2 Think these are cause of intermittent fecal smearing Treat with HC cream x 2 weeks and then prn Hold banding in reserve F/U prn     Subjective:   Chief Complaint: Heartburn and fecal smearing  HPI Patient is here because of heartburn symptoms that occur at night when he is working the third shift.  He is using omeprazole intermittently and trying to guess when he needs it.  He drinks about 5 cups of coffee when he is working.  Does not use tobacco.  History of EGD in 2012 with a ring that I dilated with a Maloney dilator no dysphagia since.  No esophagitis then.  Other complaints are intermittent fecal smearing a few times a month he will have mucus and mucoid stool in the anal area or on his underwear.  No rectal bleeding or pain.  Bowel movements are easy without straining.  He does sometimes spend a long time on the toilet.  Colonoscopy in 2017 for follow-up of subcentimeter adenoma removed in 2012, diverticulosis no polyps recall 2027.  3rd shift 430 - 0500  No Known Allergies Current Meds  Medication Sig  . fluticasone (FLONASE) 50 MCG/ACT nasal spray Place 2 sprays into both nostrils daily.  . Ipratropium-Albuterol (COMBIVENT) 20-100 MCG/ACT AERS respimat Please use 1 puff every 4-6 hours as needed. (Patient taking differently: Inhale 1 puff into the lungs every 6 (six) hours as needed for wheezing or shortness of breath. Please use 1 puff every 4-6 hours as needed.)  . levothyroxine (SYNTHROID) 112 MCG tablet TAKE ONE TABLET BY MOUTH DAILY  . omeprazole (PRILOSEC) 20 MG capsule TAKE 1  CAPSULE BY MOUTH DAILY   Past Medical History:  Diagnosis Date  . Adenomatous colon polyp   . Allergic rhinitis   . Asthma    prn Combivent seasonal   . Diverticulosis   . GERD (gastroesophageal reflux disease)   . Hemorrhoids   . Hyperlipidemia 08/2009   LDL 128, HDL 49.40,TG 78. No premature CAD/MI in [place]  . Hypothyroidism   . Low back pain syndrome   . Lower esophageal ring 06/2010   dilated  . Prostatitis    X3; Dr Lawerance Bach   Past Surgical History:  Procedure Laterality Date  . COLONOSCOPY  06/08/2010   1 adenoma, mild diverticulosis and hemorrhoids plan for repeat 2017 approximately  . INGUINAL HERNIA REPAIR Bilateral 04/03/2018   Procedure: LAPAROSCOPIC BILATERAL INGUINAL HERNIA REPAIR WITH MESH ERAS PATHWAY;  Surgeon: Johnathan Hausen, MD;  Location: [facility];  Service: General;  Laterality: Bilateral;  . SHOULDER SURGERY  2008   right  . SHOULDER SURGERY Left 2018  . UPPER GASTROINTESTINAL ENDOSCOPY  06/08/2010   Lower esophageal ring, dilated to 50 [place]. Mild gastritis, thoracic inlet patch.   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   Review of Systems As per HPI no significant dyspnea no muscle strains  Objective:   Physical Exam BP 124/72   Pulse 86   Temp 98.4 F (36.9 C) (Temporal)   Ht 6' (1.829 m)   Wt 191 lb (86.6 kg)   BMI 25.90 kg/m  Well-developed well-nourished no acute distress, middle-aged white man Eyes anicteric Appropriate mood and affect Alert and oriented x3  Rectal exam with a very tiny posterior tag normal tone no mass  brown stool nontender  Anoscopy demonstrates grade 2 mildly inflamed prolapsed internal hemorrhoids in all positions

## 2019-01-22 NOTE — Assessment & Plan Note (Signed)
Diet - reduce caffeine/coffee Take omeprazole qd before work (3rd shift)

## 2019-01-26 ENCOUNTER — Other Ambulatory Visit: Payer: Self-pay | Admitting: Family Medicine

## 2019-01-26 DIAGNOSIS — K219 Gastro-esophageal reflux disease without esophagitis: Secondary | ICD-10-CM

## 2019-02-14 ENCOUNTER — Ambulatory Visit (INDEPENDENT_AMBULATORY_CARE_PROVIDER_SITE_OTHER): Payer: Managed Care, Other (non HMO) | Admitting: Family Medicine

## 2019-02-14 ENCOUNTER — Other Ambulatory Visit: Payer: Self-pay

## 2019-02-14 ENCOUNTER — Encounter: Payer: Self-pay | Admitting: Family Medicine

## 2019-02-14 VITALS — BP 112/80 | HR 73 | Temp 97.9°F | Resp 17 | Ht 72.0 in | Wt 192.2 lb

## 2019-02-14 DIAGNOSIS — E785 Hyperlipidemia, unspecified: Secondary | ICD-10-CM | POA: Diagnosis not present

## 2019-02-14 DIAGNOSIS — Z0000 Encounter for general adult medical examination without abnormal findings: Secondary | ICD-10-CM | POA: Diagnosis not present

## 2019-02-14 LAB — CBC WITH DIFFERENTIAL/PLATELET
Basophils Absolute: 0.1 10*3/uL (ref 0.0–0.1)
Basophils Relative: 1.3 % (ref 0.0–3.0)
Eosinophils Absolute: 0.2 10*3/uL (ref 0.0–0.7)
Eosinophils Relative: 3.9 % (ref 0.0–5.0)
HCT: 40.2 % (ref 39.0–52.0)
Hemoglobin: 13.6 g/dL (ref 13.0–17.0)
Lymphocytes Relative: 24.7 % (ref 12.0–46.0)
Lymphs Abs: 1.6 10*3/uL (ref 0.7–4.0)
MCHC: 33.8 g/dL (ref 30.0–36.0)
MCV: 86.8 fl (ref 78.0–100.0)
Monocytes Absolute: 0.7 10*3/uL (ref 0.1–1.0)
Monocytes Relative: 10.5 % (ref 3.0–12.0)
Neutro Abs: 3.7 10*3/uL (ref 1.4–7.7)
Neutrophils Relative %: 59.6 % (ref 43.0–77.0)
Platelets: 470 10*3/uL — ABNORMAL HIGH (ref 150.0–400.0)
RBC: 4.63 Mil/uL (ref 4.22–5.81)
RDW: 14 % (ref 11.5–15.5)
WBC: 6.3 10*3/uL (ref 4.0–10.5)

## 2019-02-14 LAB — LIPID PANEL
Cholesterol: 169 mg/dL (ref 0–200)
HDL: 38 mg/dL — ABNORMAL LOW (ref >39.00)
LDL Cholesterol: 115 mg/dL — ABNORMAL HIGH (ref 0–99)
NonHDL: 131.02
Total CHOL/HDL Ratio: 4
Triglycerides: 81 mg/dL (ref 0.0–149.0)
VLDL: 16.2 mg/dL (ref 0.0–40.0)

## 2019-02-14 LAB — HEPATIC FUNCTION PANEL
ALT: 27 U/L (ref 0–53)
AST: 19 U/L (ref 0–37)
Albumin: 4.2 g/dL (ref 3.5–5.2)
Alkaline Phosphatase: 64 U/L (ref 39–117)
Bilirubin, Direct: 0.1 mg/dL (ref 0.0–0.3)
Total Bilirubin: 0.6 mg/dL (ref 0.2–1.2)
Total Protein: 6.8 g/dL (ref 6.0–8.3)

## 2019-02-14 LAB — BASIC METABOLIC PANEL
BUN: 29 mg/dL — ABNORMAL HIGH (ref 6–23)
CO2: 26 mEq/L (ref 19–32)
Calcium: 9.3 mg/dL (ref 8.4–10.5)
Chloride: 104 mEq/L (ref 96–112)
Creatinine, Ser: 1.1 mg/dL (ref 0.40–1.50)
GFR: 68.49 mL/min (ref >60.00)
Glucose, Bld: 99 mg/dL (ref 70–99)
Potassium: 4.2 mEq/L (ref 3.5–5.1)
Sodium: 140 mEq/L (ref 135–145)

## 2019-02-14 LAB — TSH: TSH: 2.47 u[IU]/mL (ref 0.35–4.50)

## 2019-02-14 MED ORDER — IPRATROPIUM-ALBUTEROL 20-100 MCG/ACT IN AERS: INHALATION_SPRAY | RESPIRATORY_TRACT | 6 refills | Status: DC

## 2019-02-14 NOTE — Assessment & Plan Note (Signed)
Pt's PE WNL.  UTD on colonoscopy, urology, immunizations.  Check labs.  Anticipatory guidance provided.  

## 2019-02-14 NOTE — Assessment & Plan Note (Signed)
Chronic problem.  Attempting to control w/ diet and exercise.  Check labs and determine if meds are needed. 

## 2019-02-14 NOTE — Patient Instructions (Addendum)
Follow up in 1 year or as needed We'll notify you of your lab results and make any changes if needed Continue to work on healthy diet and regular exercise- you can do it! Call with any questions or concerns Stay Safe!  Stay Healthy! Merry Christmas!

## 2019-02-14 NOTE — Progress Notes (Signed)
   Subjective:    Patient ID: TEJ KLINDT, male    DOB: 1961-02-20, 58 y.o.   MRN: [id number]  HPI CPE- UTD on colonoscopy, immunizations.   Review of Systems Patient reports no vision/hearing changes, anorexia, fever ,adenopathy, persistant/recurrent hoarseness, swallowing issues, chest pain, palpitations, edema, persistant/recurrent cough, hemoptysis, dyspnea (rest,exertional, paroxysmal nocturnal), gastrointestinal  bleeding (melena, rectal bleeding), abdominal pain, excessive heart burn, GU symptoms (dysuria, hematuria, voiding/incontinence issues) syncope, focal weakness, memory loss, numbness & tingling, skin/hair/nail changes, depression, anxiety, abnormal bruising/bleeding, musculoskeletal symptoms/signs.   This visit occurred during the SARS-CoV-2 public health emergency.  Safety protocols were in place, including screening questions prior to the visit, additional usage of staff PPE, and extensive cleaning of exam room while observing appropriate contact time as indicated for disinfecting solutions.       Objective:   Physical Exam General Appearance:    Alert, cooperative, no distress, appears stated age  Head:    Normocephalic, without obvious abnormality, atraumatic  Eyes:    PERRL, conjunctiva/corneas clear, EOM's intact, fundi    benign, both eyes       Ears:    Normal TM's and external ear canals, both ears  Nose:   Deferred due to COVID  Throat:   Neck:   Supple, symmetrical, trachea midline, no adenopathy;       thyroid:  No enlargement/tenderness/nodules  Back:     Symmetric, no curvature, ROM normal, no CVA tenderness  Lungs:     Clear to auscultation bilaterally, respirations unlabored  Chest wall:    No tenderness or deformity  Heart:    Regular rate and rhythm, S1 and S2 normal, no murmur, rub   or gallop  Abdomen:     Soft, non-tender, bowel sounds active all four quadrants,    no masses, no organomegaly  Genitalia:    Deferred to urology  Rectal:     Extremities:   Extremities normal, atraumatic, no cyanosis or edema  Pulses:   2+ and symmetric all extremities  Skin:   Skin color, texture, turgor normal, no rashes or lesions  Lymph nodes:   Cervical, supraclavicular, and axillary nodes normal  Neurologic:   CNII-XII intact. Normal strength, sensation and reflexes      throughout          Assessment & Plan:

## 2019-03-03 ENCOUNTER — Other Ambulatory Visit: Payer: Self-pay | Admitting: Family Medicine

## 2019-03-27 ENCOUNTER — Telehealth: Payer: Self-pay | Admitting: Family Medicine

## 2019-03-27 NOTE — Telephone Encounter (Signed)
Ok to switch to Albuterol inhaler, 2 puffs Q4 prn, #1, 3 refills

## 2019-03-27 NOTE — Telephone Encounter (Signed)
Pt called in stating that the Combivent inhaler was expensive. He wanted to know if something else could be sent in for him pt uses [facility] in [place].

## 2019-03-28 ENCOUNTER — Other Ambulatory Visit: Payer: Self-pay

## 2019-03-28 MED ORDER — ALBUTEROL SULFATE HFA 108 (90 BASE) MCG/ACT IN AERS: 2.0000 | INHALATION_SPRAY | RESPIRATORY_TRACT | 3 refills | Status: DC | PRN

## 2019-03-28 NOTE — Telephone Encounter (Signed)
Script sent to pharmacy.

## 2019-07-26 ENCOUNTER — Other Ambulatory Visit: Payer: Self-pay | Admitting: Family Medicine

## 2019-07-26 DIAGNOSIS — K219 Gastro-esophageal reflux disease without esophagitis: Secondary | ICD-10-CM

## 2019-12-14 ENCOUNTER — Other Ambulatory Visit: Payer: Self-pay | Admitting: Family Medicine

## 2019-12-20 ENCOUNTER — Other Ambulatory Visit: Payer: Self-pay | Admitting: Family Medicine

## 2020-04-16 ENCOUNTER — Encounter: Payer: Self-pay | Admitting: Family Medicine

## 2020-04-16 ENCOUNTER — Other Ambulatory Visit: Payer: Self-pay

## 2020-04-16 ENCOUNTER — Ambulatory Visit (INDEPENDENT_AMBULATORY_CARE_PROVIDER_SITE_OTHER): Payer: Managed Care, Other (non HMO) | Admitting: Family Medicine

## 2020-04-16 VITALS — BP 110/70 | HR 69 | Temp 97.8°F | Resp 17 | Ht 72.0 in | Wt 184.6 lb

## 2020-04-16 DIAGNOSIS — Z0000 Encounter for general adult medical examination without abnormal findings: Secondary | ICD-10-CM

## 2020-04-16 DIAGNOSIS — E038 Other specified hypothyroidism: Secondary | ICD-10-CM

## 2020-04-16 LAB — LIPID PANEL
Cholesterol: 141 mg/dL (ref 0–200)
HDL: 38.9 mg/dL — ABNORMAL LOW (ref >39.00)
LDL Cholesterol: 80 mg/dL (ref 0–99)
NonHDL: 101.89
Total CHOL/HDL Ratio: 4
Triglycerides: 108 mg/dL (ref 0.0–149.0)
VLDL: 21.6 mg/dL (ref 0.0–40.0)

## 2020-04-16 LAB — BASIC METABOLIC PANEL
BUN: 28 mg/dL — ABNORMAL HIGH (ref 6–23)
CO2: 31 mEq/L (ref 19–32)
Calcium: 9.2 mg/dL (ref 8.4–10.5)
Chloride: 105 mEq/L (ref 96–112)
Creatinine, Ser: 1.11 mg/dL (ref 0.40–1.50)
GFR: 72.3 mL/min (ref >60.00)
Glucose, Bld: 92 mg/dL (ref 70–99)
Potassium: 3.9 mEq/L (ref 3.5–5.1)
Sodium: 142 mEq/L (ref 135–145)

## 2020-04-16 LAB — CBC WITH DIFFERENTIAL/PLATELET
Basophils Absolute: 0.1 10*3/uL (ref 0.0–0.1)
Basophils Relative: 1.1 % (ref 0.0–3.0)
Eosinophils Absolute: 0.2 10*3/uL (ref 0.0–0.7)
Eosinophils Relative: 3.8 % (ref 0.0–5.0)
HCT: 39.7 % (ref 39.0–52.0)
Hemoglobin: 13.6 g/dL (ref 13.0–17.0)
Lymphocytes Relative: 24.5 % (ref 12.0–46.0)
Lymphs Abs: 1.5 10*3/uL (ref 0.7–4.0)
MCHC: 34.2 g/dL (ref 30.0–36.0)
MCV: 87.4 fl (ref 78.0–100.0)
Monocytes Absolute: 0.6 10*3/uL (ref 0.1–1.0)
Monocytes Relative: 10.3 % (ref 3.0–12.0)
Neutro Abs: 3.6 10*3/uL (ref 1.4–7.7)
Neutrophils Relative %: 60.3 % (ref 43.0–77.0)
Platelets: 496 10*3/uL — ABNORMAL HIGH (ref 150.0–400.0)
RBC: 4.54 Mil/uL (ref 4.22–5.81)
RDW: 14.2 % (ref 11.5–15.5)
WBC: 6 10*3/uL (ref 4.0–10.5)

## 2020-04-16 LAB — TSH: TSH: 2.27 u[IU]/mL (ref 0.35–4.50)

## 2020-04-16 LAB — HEPATIC FUNCTION PANEL
ALT: 12 U/L (ref 0–53)
AST: 17 U/L (ref 0–37)
Albumin: 4.2 g/dL (ref 3.5–5.2)
Alkaline Phosphatase: 63 U/L (ref 39–117)
Bilirubin, Direct: 0.1 mg/dL (ref 0.0–0.3)
Total Bilirubin: 0.4 mg/dL (ref 0.2–1.2)
Total Protein: 6.8 g/dL (ref 6.0–8.3)

## 2020-04-16 MED ORDER — IPRATROPIUM-ALBUTEROL 20-100 MCG/ACT IN AERS: INHALATION_SPRAY | RESPIRATORY_TRACT | 6 refills | Status: DC

## 2020-04-16 MED ORDER — ALBUTEROL SULFATE HFA 108 (90 BASE) MCG/ACT IN AERS: 2.0000 | INHALATION_SPRAY | RESPIRATORY_TRACT | 3 refills | Status: AC | PRN

## 2020-04-16 NOTE — Patient Instructions (Signed)
Follow up in 1 year or as needed We'll notify you of your lab results and make any changes if needed Keep up the good work on healthy diet and regular exercise- you look great! Please message me with the dates of your flu and COVID vaccines Call with any questions or concerns Stay Safe! Stay Healthy!

## 2020-04-16 NOTE — Progress Notes (Signed)
   Subjective:    Patient ID: Gregory Webb, male    DOB: 02/26/1961, 60 y.o.   MRN: [id number]  HPI CPE- UTD on colonoscopy, Tdap, flu, COVID (doesn't know the dates).  Pt has lost 8 lbs since last visit- started some exercise  Reviewed past medical, surgical, family and social histories.   Patient Care Team    Relationship Specialty Notifications Start End  Midge Minium, MD PCP - General Family Medicine  05/07/14   Gatha Mayer, MD Consulting Physician Gastroenterology  02/02/15   Myrlene Broker, MD Attending Physician Urology  11/02/16     Health Maintenance  Topic Date Due  . COVID-19 Vaccine (1) Never done  . INFLUENZA VACCINE  10/06/2019  . Hepatitis C Screening  04/16/2021 (Originally 1960-12-25)  . HIV Screening  04/16/2021 (Originally 01/08/1975)  . COLONOSCOPY (Pts 45-38yrs Insurance coverage will need to be confirmed)  09/28/2025  . TETANUS/TDAP  01/14/2029      Review of Systems Patient reports no vision/hearing changes, anorexia, fever ,adenopathy, persistant/recurrent hoarseness, swallowing issues, chest pain, palpitations, edema, persistant/recurrent cough, hemoptysis, dyspnea (rest,exertional, paroxysmal nocturnal), gastrointestinal  bleeding (melena, rectal bleeding), abdominal pain, excessive heart burn, GU symptoms (dysuria, hematuria, voiding/incontinence issues) syncope, focal weakness, memory loss, numbness & tingling, skin/hair/nail changes, depression, anxiety, abnormal bruising/bleeding, musculoskeletal symptoms/signs.   This visit occurred during the SARS-CoV-2 public health emergency.  Safety protocols were in place, including screening questions prior to the visit, additional usage of staff PPE, and extensive cleaning of exam room while observing appropriate contact time as indicated for disinfecting solutions.       Objective:   Physical Exam General Appearance:    Alert, cooperative, no distress, appears stated age  Head:    Normocephalic,  without obvious abnormality, atraumatic  Eyes:    PERRL, conjunctiva/corneas clear, EOM's intact, fundi    benign, both eyes       Ears:    Normal TM's and external ear canals, both ears  Nose:   Deferred due to COVID  Throat:   Neck:   Supple, symmetrical, trachea midline, no adenopathy;       thyroid:  No enlargement/tenderness/nodules  Back:     Symmetric, no curvature, ROM normal, no CVA tenderness  Lungs:     Clear to auscultation bilaterally, respirations unlabored  Chest wall:    No tenderness or deformity  Heart:    Regular rate and rhythm, S1 and S2 normal, no murmur, rub   or gallop  Abdomen:     Soft, non-tender, bowel sounds active all four quadrants,    no masses, no organomegaly  Genitalia:    Deferred to urology  Rectal:    Extremities:   Extremities normal, atraumatic, no cyanosis or edema  Pulses:   2+ and symmetric all extremities  Skin:   Skin color, texture, turgor normal, no rashes or lesions  Lymph nodes:   Cervical, supraclavicular, and axillary nodes normal  Neurologic:   CNII-XII intact. Normal strength, sensation and reflexes      throughout          Assessment & Plan:

## 2020-04-16 NOTE — Assessment & Plan Note (Signed)
Chronic problem.  Currently asymptomatic.  Check labs and adjust prn.

## 2020-04-16 NOTE — Assessment & Plan Note (Signed)
Pt's PE WNL.  UTD on colonoscopy, immunizations (not aware of dates), and urology.  Check labs.  Anticipatory guidance provided.

## 2020-05-26 ENCOUNTER — Other Ambulatory Visit: Payer: Self-pay

## 2020-05-26 ENCOUNTER — Ambulatory Visit: Payer: Managed Care, Other (non HMO) | Admitting: Family Medicine

## 2020-05-26 ENCOUNTER — Encounter: Payer: Self-pay | Admitting: Family Medicine

## 2020-05-26 VITALS — BP 118/70 | HR 53 | Temp 97.7°F | Resp 16 | Ht 72.0 in | Wt 185.4 lb

## 2020-05-26 DIAGNOSIS — R0982 Postnasal drip: Secondary | ICD-10-CM

## 2020-05-26 MED ORDER — FLUTICASONE PROPIONATE 50 MCG/ACT NA SUSP: 2.0000 | Freq: Every day | NASAL | 6 refills | Status: DC

## 2020-05-26 MED ORDER — CETIRIZINE HCL 10 MG PO TABS: 10.0000 mg | ORAL_TABLET | Freq: Every day | ORAL | 11 refills | Status: AC

## 2020-05-26 NOTE — Progress Notes (Signed)
   Subjective:    Patient ID: Gregory Webb, male    DOB: Jun 26, 1960, 60 y.o.   MRN: [id number]  HPI 'my throat feels kinda tight and swollen'- sxs started ~1 week ago.  No fever.  Some difficulty w/ swallowing- 'it feels like a little lump in my throat'.  + PND.  Not currently taking allergy medication.  sxs do not wax and wane- 'about the same all day'.  No known sick contacts.  No N/V.  Denies HAs.     Review of Systems For ROS see HPI   This visit occurred during the SARS-CoV-2 public health emergency.  Safety protocols were in place, including screening questions prior to the visit, additional usage of staff PPE, and extensive cleaning of exam room while observing appropriate contact time as indicated for disinfecting solutions.       Objective:   Physical Exam Vitals reviewed.  Constitutional:      General: He is not in acute distress.    Appearance: Normal appearance. He is not ill-appearing.  HENT:     Head: Normocephalic and atraumatic.     Nose: Congestion present.     Mouth/Throat:     Mouth: Mucous membranes are moist.     Comments: Copious PND Musculoskeletal:     Cervical back: Normal range of motion and neck supple. No rigidity or tenderness.  Lymphadenopathy:     Cervical: No cervical adenopathy.  Skin:    General: Skin is warm and dry.  Neurological:     General: No focal deficit present.     Mental Status: He is alert and oriented to person, place, and time.  Psychiatric:        Mood and Affect: Mood normal.        Behavior: Behavior normal.        Thought Content: Thought content normal.           Assessment & Plan:  PND- pt has copious PND and this is the most likely cause of his sore throat and swollen feeling in the absence of other sxs.  No evidence of strep, no LAD, no difficulty breathing or swallowing in office today.  Start daily antihistamine and add nasal steroid to improve sxs.  Pt expressed understanding and is in agreement w/ plan.

## 2020-05-26 NOTE — Patient Instructions (Signed)
Follow up as needed or as scheduled Your sore throat and tightness are likely due to all of the post-nasal drip and the irritation it is causing Drink LOTS of water START daily Zyrtec (Cetirizine) to decrease the drainage ADD the Flonase (Fluticasone) 2 sprays each nostril daily Call with any questions or concerns Happy [place]!!!

## 2020-06-22 ENCOUNTER — Other Ambulatory Visit: Payer: Self-pay

## 2020-06-22 DIAGNOSIS — E038 Other specified hypothyroidism: Secondary | ICD-10-CM

## 2020-06-22 MED ORDER — LEVOTHYROXINE SODIUM 112 MCG PO TABS: 112.0000 ug | ORAL_TABLET | Freq: Every day | ORAL | 3 refills | Status: DC

## 2020-07-24 ENCOUNTER — Other Ambulatory Visit: Payer: Self-pay | Admitting: Family Medicine

## 2020-07-24 DIAGNOSIS — K219 Gastro-esophageal reflux disease without esophagitis: Secondary | ICD-10-CM

## 2020-07-28 ENCOUNTER — Encounter: Payer: Self-pay | Admitting: Family Medicine

## 2020-07-28 ENCOUNTER — Other Ambulatory Visit: Payer: Self-pay

## 2020-07-28 ENCOUNTER — Ambulatory Visit: Payer: Managed Care, Other (non HMO) | Admitting: Family Medicine

## 2020-07-28 VITALS — BP 112/70 | HR 58 | Temp 97.8°F | Resp 18 | Ht 73.0 in | Wt 184.2 lb

## 2020-07-28 DIAGNOSIS — K219 Gastro-esophageal reflux disease without esophagitis: Secondary | ICD-10-CM | POA: Diagnosis not present

## 2020-07-28 DIAGNOSIS — R109 Unspecified abdominal pain: Secondary | ICD-10-CM

## 2020-07-28 DIAGNOSIS — L819 Disorder of pigmentation, unspecified: Secondary | ICD-10-CM | POA: Diagnosis not present

## 2020-07-28 LAB — POCT URINALYSIS DIPSTICK
Bilirubin, UA: NEGATIVE
Blood, UA: NEGATIVE
Glucose, UA: NEGATIVE
Ketones, UA: NEGATIVE
Leukocytes, UA: NEGATIVE
Nitrite, UA: NEGATIVE
Protein, UA: POSITIVE — AB
Spec Grav, UA: 1.015 (ref 1.010–1.025)
Urobilinogen, UA: 0.2 E.U./dL
pH, UA: 6 (ref 5.0–8.0)

## 2020-07-28 LAB — CBC WITH DIFFERENTIAL/PLATELET
Basophils Absolute: 0.1 10*3/uL (ref 0.0–0.1)
Basophils Relative: 1 % (ref 0.0–3.0)
Eosinophils Absolute: 0.2 10*3/uL (ref 0.0–0.7)
Eosinophils Relative: 3.7 % (ref 0.0–5.0)
HCT: 39.1 % (ref 39.0–52.0)
Hemoglobin: 13.4 g/dL (ref 13.0–17.0)
Lymphocytes Relative: 27.9 % (ref 12.0–46.0)
Lymphs Abs: 1.5 10*3/uL (ref 0.7–4.0)
MCHC: 34.2 g/dL (ref 30.0–36.0)
MCV: 87.2 fl (ref 78.0–100.0)
Monocytes Absolute: 0.5 10*3/uL (ref 0.1–1.0)
Monocytes Relative: 8.4 % (ref 3.0–12.0)
Neutro Abs: 3.2 10*3/uL (ref 1.4–7.7)
Neutrophils Relative %: 59 % (ref 43.0–77.0)
Platelets: 485 10*3/uL — ABNORMAL HIGH (ref 150.0–400.0)
RBC: 4.48 Mil/uL (ref 4.22–5.81)
RDW: 14.4 % (ref 11.5–15.5)
WBC: 5.4 10*3/uL (ref 4.0–10.5)

## 2020-07-28 LAB — BASIC METABOLIC PANEL
BUN: 27 mg/dL — ABNORMAL HIGH (ref 6–23)
CO2: 31 mEq/L (ref 19–32)
Calcium: 9.3 mg/dL (ref 8.4–10.5)
Chloride: 105 mEq/L (ref 96–112)
Creatinine, Ser: 1.11 mg/dL (ref 0.40–1.50)
GFR: 72.15 mL/min (ref >60.00)
Glucose, Bld: 92 mg/dL (ref 70–99)
Potassium: 4.2 mEq/L (ref 3.5–5.1)
Sodium: 141 mEq/L (ref 135–145)

## 2020-07-28 MED ORDER — OMEPRAZOLE 20 MG PO CPDR: 1.0000 | DELAYED_RELEASE_CAPSULE | Freq: Every day | ORAL | 1 refills | Status: DC

## 2020-07-28 MED ORDER — CYCLOBENZAPRINE HCL 10 MG PO TABS
10.0000 mg | ORAL_TABLET | Freq: Every evening | ORAL | 0 refills | Status: DC | PRN
Start: 2020-07-28 — End: 2021-02-11

## 2020-07-28 MED ORDER — MELOXICAM 15 MG PO TABS: 15.0000 mg | ORAL_TABLET | Freq: Every day | ORAL | 0 refills | Status: DC

## 2020-07-28 NOTE — Patient Instructions (Addendum)
Follow up as needed or as scheduled We'll notify you of your lab results and make any changes if needed Take the Meloxicam (w/ food) for 7-10 days to improve your back pain Use the cyclobenzaprine at night as needed for muscle spasms Heating pad as needed for pain Go to the pharmacy and get OTC Clotrimazole cream and put it on the discolored ares twice daily Drink plenty of water Call w/ any questions or concerns [place]!

## 2020-07-28 NOTE — Assessment & Plan Note (Signed)
Refill sent on Omeprazole

## 2020-07-28 NOTE — Progress Notes (Signed)
   Subjective:    Patient ID: Gregory Webb, male    DOB: [birth year], 60 y.o.   MRN: [id number]  HPI Rash- pt reports bilateral flanks around to his back.  First noticed ~10 days ago.  Doesn't itch or burn.  Area is discolored.  Also having pain on both side, R>L that radiates around to back.  Nothing improves pain, nothing makes it worse.  No change w/ movement.  No trauma.  Pt has been doing a lot of yard work.  No burning w/ urination, no hematuria   Review of Systems For ROS see HPI   This visit occurred during the SARS-CoV-2 public health emergency.  Safety protocols were in place, including screening questions prior to the visit, additional usage of staff PPE, and extensive cleaning of exam room while observing appropriate contact time as indicated for disinfecting solutions.       Objective:   Physical Exam Vitals reviewed.  Constitutional:      General: He is not in acute distress.    Appearance: Normal appearance. He is not ill-appearing.  Musculoskeletal:        General: No tenderness (no TTP over either flank or paraspinal muscles).  Skin:    General: Skin is warm and dry.     Findings: No rash.     Comments: 2 band like areas of hyperpigmentation on pt's R flank that would be skin folds when he bends 1 band like area of hyperpigmentation on L hip at waistline, also in a fold  Neurological:     General: No focal deficit present.     Mental Status: He is alert and oriented to person, place, and time.     Coordination: Coordination normal.     Deep Tendon Reflexes: Reflexes normal.     Comments: (-) SLR bilaterally           Assessment & Plan:  Skin discoloration- new.  Not a true rash.  Not itchy or painful.  Suspect this may be fungal as the linear areas of discolorations are in skin folds when he bends or moves.  Start OTC Clotrimazole  Flank pain- new.  Bilateral.  Suspect this is musculoskeletal based on the amount of recent yard work.  No red flags on hx or  PE.  Negative SLR, normal gait, able to rise from a seated position w/o difficulty.  Denies dysuria or hematuria but will get UA and check Cr.  Check CBC.  Start scheduled NSAID and muscle relaxer PRN.  Reviewed supportive care and red flags that should prompt return.  Pt expressed understanding and is in agreement w/ plan.

## 2020-09-02 ENCOUNTER — Encounter: Payer: Self-pay | Admitting: *Deleted

## 2020-12-21 ENCOUNTER — Ambulatory Visit: Payer: Managed Care, Other (non HMO) | Admitting: Internal Medicine

## 2020-12-29 ENCOUNTER — Other Ambulatory Visit: Payer: Self-pay

## 2020-12-29 DIAGNOSIS — E038 Other specified hypothyroidism: Secondary | ICD-10-CM

## 2020-12-29 MED ORDER — LEVOTHYROXINE SODIUM 112 MCG PO TABS: 112.0000 ug | ORAL_TABLET | Freq: Every day | ORAL | 3 refills | Status: DC

## 2021-02-11 ENCOUNTER — Ambulatory Visit: Payer: Managed Care, Other (non HMO) | Admitting: Registered Nurse

## 2021-02-11 ENCOUNTER — Encounter: Payer: Self-pay | Admitting: Registered Nurse

## 2021-02-11 VITALS — BP 130/76 | HR 61 | Temp 98.2°F | Wt 191.0 lb

## 2021-02-11 DIAGNOSIS — M545 Low back pain, unspecified: Secondary | ICD-10-CM | POA: Diagnosis not present

## 2021-02-11 MED ORDER — CYCLOBENZAPRINE HCL 10 MG PO TABS: 5.0000 mg | ORAL_TABLET | Freq: Three times a day (TID) | ORAL | 0 refills | Status: DC | PRN

## 2021-02-11 MED ORDER — DICLOFENAC SODIUM 75 MG PO TBEC: 75.0000 mg | DELAYED_RELEASE_TABLET | Freq: Two times a day (BID) | ORAL | 0 refills | Status: DC

## 2021-02-11 NOTE — Progress Notes (Signed)
Established Patient Office Visit  Subjective:  Patient ID: Gregory Webb, male    DOB: [birth year]  Age: 60 y.o. MRN: [id number]  CC:  Chief Complaint  Patient presents with   Back Pain    Lower right side, x 2 weeks, thinks he may have aggravated his back working on his trailer.     HPI MOUA [name] presents for back pain  Onset 2 weeks ago when working on trailer Lower right back, into buttock. Feels worsening. Can be aching, sharp, stabbing, cramping.  Has been using heating pad with some relief Stretching with limited relief Taking advil with relief  No radiculopathy or myelopathy.   Hx of renal cysts - followed by urology - these are stable  Denies any new urinary or bowel symptoms.   Past Medical History:  Diagnosis Date   Adenomatous colon polyp    Allergic rhinitis    Asthma    prn Combivent seasonal    Diverticulosis    GERD (gastroesophageal reflux disease)    Hemorrhoids    Hyperlipidemia 08/2009   LDL 128, HDL 49.40,TG 78. No premature CAD/MI in FH   Hypothyroidism    Low back pain syndrome    Lower esophageal ring 06/2010   dilated   Prostatitis    X3; Dr Lawerance Bach    Past Surgical History:  Procedure Laterality Date   COLONOSCOPY  06/08/2010   1 adenoma, mild diverticulosis and hemorrhoids plan for repeat 2017 approximately   INGUINAL HERNIA REPAIR Bilateral 04/03/2018   Procedure: LAPAROSCOPIC BILATERAL INGUINAL HERNIA REPAIR WITH MESH ERAS PATHWAY;  Surgeon: Johnathan Hausen, MD;  Location: [facility];  Service: General;  Laterality: Bilateral;   SHOULDER SURGERY  2008   right   SHOULDER SURGERY Left 2018   UPPER GASTROINTESTINAL ENDOSCOPY  06/08/2010   Lower esophageal ring, dilated to 23 [place]. Mild gastritis, thoracic inlet patch.    Family History  Problem Relation Age of Onset   Breast cancer Mother    Hyperlipidemia Mother    Melanoma Mother    Colon cancer Paternal Grandmother    Stroke Maternal Grandmother        late 20s    Heart attack Paternal Grandfather        in 56s   Stroke Maternal Grandfather        late 95s   Diabetes Neg Hx     Social History   Socioeconomic History   Marital status: Married    Spouse name: Not on file   Number of children: 2   Years of education: Not on file   Highest education level: Not on file  Occupational History    Employer: [employer]  Tobacco Use   Smoking status: Former    Types: Cigarettes    Quit date: 06/06/1980    Years since quitting: 40.7   Smokeless tobacco: Former   Tobacco comments:    smoked [age] , up to 1 pp WEEK  Vaping Use   Vaping Use: Never used  Substance and Sexual Activity   Alcohol use: Yes    Alcohol/week: 3.0 - 4.0 standard drinks    Types: 3 - 4 Standard drinks or equivalent per week    Comment: 2-3 beers per week    Drug use: No   Sexual activity: Not on file  Other Topics Concern   Not on file  Social History Narrative   Patient is married 2 daughters that are grown   2 caffeinated beverages daily  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 8 g 3   cetirizine (ZYRTEC) 10 MG tablet Take 1 tablet (10 mg total) by mouth daily. 30 tablet 11   fluticasone (FLONASE) 50 MCG/ACT nasal spray Place 2 sprays into both nostrils daily. 16 g 6   Ipratropium-Albuterol (COMBIVENT) 20-100 MCG/ACT AERS respimat Please use 1 puff every 4-6 hours as needed. 4 g 6   levothyroxine (SYNTHROID) 112 MCG tablet Take 1  tablet (112 mcg total) by mouth daily. 30 tablet 3   omeprazole (PRILOSEC) 20 MG capsule Take 1 capsule (20 mg total) by mouth daily. 30 capsule 1   cyclobenzaprine (FLEXERIL) 10 MG tablet Take 1 tablet (10 mg total) by mouth at bedtime as needed for muscle spasms. 30 tablet 0   meloxicam (MOBIC) 15 MG tablet Take 1 tablet (15 mg total) by mouth daily. 30 tablet 0   No facility-administered medications prior to visit.    No Known Allergies  ROS Review of Systems Per hpi    Objective:    Physical Exam Constitutional:      General: He is not in acute distress.    Appearance: Normal appearance. He is normal weight. He is not ill-appearing, toxic-appearing or diaphoretic.  Cardiovascular:     Rate and Rhythm: Normal rate and regular rhythm.     Heart sounds: Normal heart sounds. No murmur heard.   No friction rub. No gallop.  Pulmonary:     Effort: Pulmonary effort is normal. No respiratory distress.     Breath sounds: Normal breath sounds. No stridor. No wheezing, rhonchi or rales.  Chest:     Chest wall: No tenderness.  Musculoskeletal:        General: Tenderness present. No swelling, deformity or signs of injury. Normal range of motion.     Right lower leg: No edema.     Left lower leg: No edema.     Comments: Negative slr Pulses intact No swelling or tenderness in legs.  Neurological:     General: No focal deficit present.     Mental Status: He is alert and oriented to person, place, and time. Mental status is at baseline.  Psychiatric:        Mood and Affect: Mood normal.        Behavior: Behavior normal.        Thought Content: Thought content normal.        Judgment: Judgment normal.    BP 130/76 (BP Location: Left Arm, Patient Position: Sitting, Cuff Size: Normal)   Pulse 61   Temp 98.2 F (36.8 C) (Oral)   Wt 191 lb (86.6 kg)   SpO2 95%   BMI 25.20 kg/m  Wt Readings from Last 3 Encounters:  02/11/21 191 lb (86.6 kg)  07/28/20 184 lb 3.2 oz (83.6 kg)   05/26/20 185 lb 6.4 oz (84.1 kg)     Health Maintenance Due  Topic Date Due  Zoster Vaccines- Shingrix (1 of 2) Never done   INFLUENZA VACCINE  10/05/2020    There are no preventive care reminders to display for this patient.  Lab Results  Component Value Date   TSH 2.27 04/16/2020   Lab Results  Component Value Date   WBC 5.4 07/28/2020   HGB 13.4 07/28/2020   HCT 39.1 07/28/2020   MCV 87.2 07/28/2020   PLT 485.0 (H) 07/28/2020   Lab Results  Component Value Date   NA 141 07/28/2020   K 4.2 07/28/2020   CO2 31 07/28/2020   GLUCOSE 92 07/28/2020   BUN 27 (H) 07/28/2020   CREATININE 1.11 07/28/2020   BILITOT 0.4 04/16/2020   ALKPHOS 63 04/16/2020   AST 17 04/16/2020   ALT 12 04/16/2020   PROT 6.8 04/16/2020   ALBUMIN 4.2 04/16/2020   CALCIUM 9.3 07/28/2020   ANIONGAP 5 01/30/2015   GFR 72.15 07/28/2020   Lab Results  Component Value Date   CHOL 141 04/16/2020   Lab Results  Component Value Date   HDL 38.90 (L) 04/16/2020   Lab Results  Component Value Date   LDLCALC 80 04/16/2020   Lab Results  Component Value Date   TRIG 108.0 04/16/2020   Lab Results  Component Value Date   CHOLHDL 4 04/16/2020   No results found for: HGBA1C    Assessment & Plan:   Problem List Items Addressed This Visit   None Visit Diagnoses     Acute right-sided low back pain without sciatica    -  Primary   Relevant Medications   diclofenac (VOLTAREN) 75 MG EC tablet   cyclobenzaprine (FLEXERIL) 10 MG tablet       Meds ordered this encounter  Medications   diclofenac (VOLTAREN) 75 MG EC tablet    Sig: Take 1 tablet (75 mg total) by mouth 2 (two) times daily.    Dispense:  30 tablet    Refill:  0    Order Specific Question:   Supervising Provider    Answer:   Carlota Raspberry, JEFFREY R [[id number]]   cyclobenzaprine (FLEXERIL) 10 MG tablet    Sig: Take 0.5-1 tablets (5-10 mg total) by mouth 3 (three) times daily as needed for muscle spasms.    Dispense:  30 tablet     Refill:  0    Order Specific Question:   Supervising Provider    Answer:   Carlota Raspberry, JEFFREY R [[id number]]    Follow-up: Return if symptoms worsen or fail to improve.   PLAN Msk etiology based on hx and exam Tx with nsaid and muscle relaxer as above Return prn. Can consider PT referral if relief limited Patient encouraged to call clinic with any questions, comments, or concerns.  Maximiano Coss, NP

## 2021-02-11 NOTE — Patient Instructions (Signed)
Mr. Shearn -   Doristine Devoid to meet you  Try diclofenac and flexeril  Can consider physical therapy if improvement is limited.  Call if worsening  If any red flags arise - blood in urine, stool incontinence, weakness in legs - let us know and seek care immediately. I doubt these will occur.   Thank you  Rich

## 2021-02-18 ENCOUNTER — Encounter: Payer: Self-pay | Admitting: Internal Medicine

## 2021-02-18 ENCOUNTER — Ambulatory Visit: Payer: Managed Care, Other (non HMO) | Admitting: Internal Medicine

## 2021-02-18 VITALS — BP 122/80 | HR 66 | Ht 72.0 in | Wt 192.2 lb

## 2021-02-18 DIAGNOSIS — K219 Gastro-esophageal reflux disease without esophagitis: Secondary | ICD-10-CM | POA: Diagnosis not present

## 2021-02-18 MED ORDER — OMEPRAZOLE 20 MG PO CPDR: 20.0000 mg | DELAYED_RELEASE_CAPSULE | Freq: Every day | ORAL | 3 refills | Status: DC

## 2021-02-18 NOTE — Patient Instructions (Signed)
Take your omeprazole daily. If that doesn't help please call us.   If you are age 61 or older, your body mass index should be between 23-30. Your Body mass index is 26.07 kg/m. If this is out of the aforementioned range listed, please consider follow up with your Primary Care Provider.  If you are age 31 or younger, your body mass index should be between 19-25. Your Body mass index is 26.07 kg/m. If this is out of the aformentioned range listed, please consider follow up with your Primary Care Provider.   ________________________________________________________  The [facility] providers would like to encourage you to use [facility] to communicate with providers for non-urgent requests or questions.  Due to long hold times on the telephone, sending your provider a message by [facility] may be a faster and more efficient way to get a response.  Please allow 48 business hours for a response.  Please remember that this is for non-urgent requests.  _______________________________________________________   I appreciate the opportunity to care for you. Silvano Rusk, MD, [facility]

## 2021-02-18 NOTE — Progress Notes (Signed)
Gregory Webb 60 y.o. [birth year] [id number]  Assessment & Plan:   Encounter Diagnosis  Name Primary?   Gastroesophageal reflux disease without esophagitis Yes    I think he needs to take his omeprazole daily.  He will do this and if his intermittent heartburn symptoms do not resolve he will contact me and we will consider increasing the dose versus repeat endoscopy.  He has 2 or 3 episodes of transient dysphagia a year this is very stable and this may improve with regular use of his omeprazole.  I do not think that is an indication to repeat an endoscopy.  CC: Midge Minium, MD   Subjective:   Chief Complaint: Heartburn, GERD  HPI Patient has a history of reflux also Schatzki ring status post dilation.  He has gone from taking his omeprazole every day to taking it most days out of the week and he will get episodes of heartburn if he does not take his medication.  This happens a few times a month.  About 2 or 3 times a year he may have some transient dysphagia which is not a change.  He has no exertional symptoms.  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   No Known Allergies Current Meds  Medication Sig   albuterol (VENTOLIN HFA) 108 (90 Base) MCG/ACT inhaler Inhale 2 puffs into the lungs every 4 (four) hours as needed for wheezing or shortness of breath.   cetirizine (ZYRTEC) 10 MG tablet Take 1 tablet (10 mg total) by mouth daily.   cyclobenzaprine (FLEXERIL) 10 MG tablet Take 0.5-1 tablets (5-10 mg total) by mouth 3 (three) times daily as needed for muscle spasms.    diclofenac (VOLTAREN) 75 MG EC tablet Take 1 tablet (75 mg total) by mouth 2 (two) times daily.   fluticasone (FLONASE) 50 MCG/ACT nasal spray Place 2 sprays into both nostrils daily.   Ipratropium-Albuterol (COMBIVENT) 20-100 MCG/ACT AERS respimat Please use 1 puff every 4-6 hours as needed.   levothyroxine (SYNTHROID) 112 MCG tablet Take 1 tablet (112 mcg total) by mouth daily.   [omeprazole (PRILOSEC) 20 MG capsule Take 1 capsule (20 mg total) by mouth daily. (Patient taking differently: Take 1 capsule by mouth daily. Takes as needed)   Past Medical History:  Diagnosis Date   Adenomatous colon polyp    Allergic rhinitis    Asthma    prn Combivent seasonal    Diverticulosis    GERD (gastroesophageal reflux disease)    Hemorrhoids    Hyperlipidemia 08/2009   LDL 128, HDL 49.40,TG 78. No premature CAD/MI in FH   Hypothyroidism    Low back pain syndrome    Lower esophageal ring 06/2010   dilated   Prostatitis    X3; Dr Lawerance Bach   Past Surgical History:  Procedure Laterality Date   COLONOSCOPY  06/08/2010   1 adenoma, mild diverticulosis and hemorrhoids plan for repeat 2017 approximately   INGUINAL HERNIA REPAIR Bilateral 04/03/2018   Procedure: LAPAROSCOPIC BILATERAL INGUINAL HERNIA REPAIR WITH MESH ERAS PATHWAY;  Surgeon: Johnathan Hausen, MD;  Location: [facility];  Service: General;  Laterality: Bilateral;   SHOULDER SURGERY  2008   right   SHOULDER SURGERY Left 2018   UPPER GASTROINTESTINAL ENDOSCOPY  06/08/2010   Lower esophageal ring, dilated to 50 [place]. Mild gastritis, thoracic inlet patch.   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   Review of Systems As per HPI  Objective:   Physical Exam BP 122/80    Pulse 66    Ht 6' (1.829 m)    Wt 192 lb 4 oz (87.2 kg)    SpO2 96%    BMI 26.07 kg/m  Lungs are clear Heart sounds are normal S1-S2 no rubs murmurs or gallops Abdomen nontender

## 2021-04-22 ENCOUNTER — Encounter: Payer: Self-pay | Admitting: Family Medicine

## 2021-04-22 ENCOUNTER — Ambulatory Visit (INDEPENDENT_AMBULATORY_CARE_PROVIDER_SITE_OTHER): Payer: Managed Care, Other (non HMO) | Admitting: Family Medicine

## 2021-04-22 VITALS — BP 110/82 | HR 72 | Temp 97.3°F | Resp 16 | Ht 72.0 in | Wt 193.8 lb

## 2021-04-22 DIAGNOSIS — E038 Other specified hypothyroidism: Secondary | ICD-10-CM | POA: Diagnosis not present

## 2021-04-22 DIAGNOSIS — Z0000 Encounter for general adult medical examination without abnormal findings: Secondary | ICD-10-CM

## 2021-04-22 DIAGNOSIS — Z1159 Encounter for screening for other viral diseases: Secondary | ICD-10-CM

## 2021-04-22 DIAGNOSIS — Z114 Encounter for screening for human immunodeficiency virus [HIV]: Secondary | ICD-10-CM

## 2021-04-22 DIAGNOSIS — Z125 Encounter for screening for malignant neoplasm of prostate: Secondary | ICD-10-CM | POA: Diagnosis not present

## 2021-04-22 LAB — BASIC METABOLIC PANEL
BUN: 27 mg/dL — ABNORMAL HIGH (ref 6–23)
CO2: 35 mEq/L — ABNORMAL HIGH (ref 19–32)
Calcium: 9.2 mg/dL (ref 8.4–10.5)
Chloride: 101 mEq/L (ref 96–112)
Creatinine, Ser: 1.2 mg/dL (ref 0.40–1.50)
GFR: 65.37 mL/min (ref >60.00)
Glucose, Bld: 95 mg/dL (ref 70–99)
Potassium: 4.1 mEq/L (ref 3.5–5.1)
Sodium: 137 mEq/L (ref 135–145)

## 2021-04-22 LAB — HEPATIC FUNCTION PANEL
ALT: 33 U/L (ref 0–53)
AST: 32 U/L (ref 0–37)
Albumin: 4.4 g/dL (ref 3.5–5.2)
Alkaline Phosphatase: 72 U/L (ref 39–117)
Bilirubin, Direct: 0.1 mg/dL (ref 0.0–0.3)
Total Bilirubin: 0.7 mg/dL (ref 0.2–1.2)
Total Protein: 6.9 g/dL (ref 6.0–8.3)

## 2021-04-22 LAB — LIPID PANEL
Cholesterol: 189 mg/dL (ref 0–200)
HDL: 40.5 mg/dL (ref >39.00)
LDL Cholesterol: 127 mg/dL — ABNORMAL HIGH (ref 0–99)
NonHDL: 148.03
Total CHOL/HDL Ratio: 5
Triglycerides: 107 mg/dL (ref 0.0–149.0)
VLDL: 21.4 mg/dL (ref 0.0–40.0)

## 2021-04-22 LAB — CBC WITH DIFFERENTIAL/PLATELET
Basophils Absolute: 0.1 10*3/uL (ref 0.0–0.1)
Basophils Relative: 1.4 % (ref 0.0–3.0)
Eosinophils Absolute: 0.3 10*3/uL (ref 0.0–0.7)
Eosinophils Relative: 5.1 % — ABNORMAL HIGH (ref 0.0–5.0)
HCT: 38.3 % — ABNORMAL LOW (ref 39.0–52.0)
Hemoglobin: 13.1 g/dL (ref 13.0–17.0)
Lymphocytes Relative: 23.3 % (ref 12.0–46.0)
Lymphs Abs: 1.3 10*3/uL (ref 0.7–4.0)
MCHC: 34.3 g/dL (ref 30.0–36.0)
MCV: 85.6 fl (ref 78.0–100.0)
Monocytes Absolute: 0.5 10*3/uL (ref 0.1–1.0)
Monocytes Relative: 9 % (ref 3.0–12.0)
Neutro Abs: 3.3 10*3/uL (ref 1.4–7.7)
Neutrophils Relative %: 61.2 % (ref 43.0–77.0)
Platelets: 488 10*3/uL — ABNORMAL HIGH (ref 150.0–400.0)
RBC: 4.48 Mil/uL (ref 4.22–5.81)
RDW: 14.6 % (ref 11.5–15.5)
WBC: 5.4 10*3/uL (ref 4.0–10.5)

## 2021-04-22 LAB — TSH: TSH: 5.42 u[IU]/mL (ref 0.35–5.50)

## 2021-04-22 LAB — PSA: PSA: 11.38 ng/mL — ABNORMAL HIGH (ref 0.10–4.00)

## 2021-04-22 NOTE — Progress Notes (Signed)
° °  Subjective:    Patient ID: Gregory Webb, male    DOB: 1960/10/21, 61 y.o.   MRN: [id number]  HPI CPE- UTD on colonoscopy, Tdap.  Pt has no concerns today.  Patient Care Team    Relationship Specialty Notifications Start End  Midge Minium, MD PCP - General Family Medicine  05/07/14   Gatha Mayer, MD Consulting Physician Gastroenterology  02/02/15   Myrlene Broker, MD Attending Physician Urology  11/02/16     Health Maintenance  Topic Date Due   Hepatitis C Screening  Never done   INFLUENZA VACCINE  06/04/2021 (Originally 10/05/2020)   Zoster Vaccines- Shingrix (1 of 2) 07/20/2021 (Originally 01/07/2010)   COLONOSCOPY (Pts 45-65yrs Insurance coverage will need to be confirmed)  09/28/2025   TETANUS/TDAP  01/14/2029   HIV Screening  Completed   HPV VACCINES  Aged Out   COVID-19 Vaccine  Discontinued      Review of Systems Patient reports no vision/hearing changes, anorexia, fever ,adenopathy, persistant/recurrent hoarseness, swallowing issues, chest pain, palpitations, edema, persistant/recurrent cough, hemoptysis, dyspnea (rest,exertional, paroxysmal nocturnal), gastrointestinal  bleeding (melena, rectal bleeding), abdominal pain, excessive heart burn, GU symptoms (dysuria, hematuria, voiding/incontinence issues) syncope, focal weakness, memory loss, numbness & tingling, skin/hair/nail changes, depression, anxiety, abnormal bruising/bleeding, musculoskeletal symptoms/signs.   This visit occurred during the SARS-CoV-2 public health emergency.  Safety protocols were in place, including screening questions prior to the visit, additional usage of staff PPE, and extensive cleaning of exam room while observing appropriate contact time as indicated for disinfecting solutions.      Objective:   Physical Exam General Appearance:    Alert, cooperative, no distress, appears stated age  Head:    Normocephalic, without obvious abnormality, atraumatic  Eyes:    PERRL,  conjunctiva/corneas clear, EOM's intact, fundi    benign, both eyes       Ears:    Normal TM's and external ear canals, both ears  Nose:   Deferred due to COVID  Throat:   Neck:   Supple, symmetrical, trachea midline, no adenopathy;       thyroid:  No enlargement/tenderness/nodules  Back:     Symmetric, no curvature, ROM normal, no CVA tenderness  Lungs:     Clear to auscultation bilaterally, respirations unlabored  Chest wall:    No tenderness or deformity  Heart:    Regular rate and rhythm, S1 and S2 normal, no murmur, rub   or gallop  Abdomen:     Soft, non-tender, bowel sounds active all four quadrants,    no masses, no organomegaly  Genitalia:    Deferred to urology  Rectal:    Extremities:   Extremities normal, atraumatic, no cyanosis or edema  Pulses:   2+ and symmetric all extremities  Skin:   Skin color, texture, turgor normal, no rashes or lesions  Lymph nodes:   Cervical, supraclavicular, and axillary nodes normal  Neurologic:   CNII-XII intact. Normal strength, sensation and reflexes      throughout          Assessment & Plan:

## 2021-04-22 NOTE — Assessment & Plan Note (Signed)
Pt's PE WNL.  UTD on colonoscopy, Tdap.  Check labs.  Anticipatory guidance provided.  

## 2021-04-22 NOTE — Patient Instructions (Signed)
Follow up in 1 year or as needed We'll notify you of your lab results and make any changes if needed Keep up the good work on healthy diet and regular exercise- you look great! Call with any questions or concerns Stay Safe!  Stay Healthy! Enjoy your vacation days!!!

## 2021-04-22 NOTE — Assessment & Plan Note (Signed)
Chronic problem.  Currently asymptomatic.  Check labs.  Adjust meds prn  

## 2021-04-23 ENCOUNTER — Telehealth: Payer: Self-pay

## 2021-04-23 LAB — HEPATITIS C ANTIBODY
Hepatitis C Ab: NONREACTIVE
SIGNAL TO CUT-OFF: 0.03 (ref <1.00)

## 2021-04-23 LAB — HIV ANTIBODY (ROUTINE TESTING W REFLEX): HIV 1&2 Ab, 4th Generation: NONREACTIVE

## 2021-04-23 NOTE — Telephone Encounter (Signed)
Patient returned my call, he is aware and no questions at this time.

## 2021-04-23 NOTE — Telephone Encounter (Signed)
-----   Message from Midge Minium, MD sent at 04/23/2021  7:35 AM EST ----- Your total cholesterol and LDL (bad cholesterol) have both jumped by about 50 points.  This will improve w/ healthy diet and regular exercise.  Your platelets are stable  Your PSA has jumped considerably and is now 11.38  You need an appt with your urologist ASAP to discuss if you haven't seen him recently  Remainder of labs look good

## 2021-04-23 NOTE — Telephone Encounter (Signed)
LVM for patient to return my call 

## 2021-06-17 ENCOUNTER — Other Ambulatory Visit: Payer: Self-pay

## 2021-06-17 ENCOUNTER — Telehealth: Payer: Self-pay | Admitting: Family Medicine

## 2021-06-17 DIAGNOSIS — E038 Other specified hypothyroidism: Secondary | ICD-10-CM

## 2021-06-17 MED ORDER — LEVOTHYROXINE SODIUM 112 MCG PO TABS: 112.0000 ug | ORAL_TABLET | Freq: Every day | ORAL | 3 refills | Status: DC

## 2021-06-17 NOTE — Telephone Encounter (Signed)
Rx sent 

## 2021-06-17 NOTE — Telephone Encounter (Signed)
Pt states he is completely out of rx.  ? ?Medication: levothyroxine (SYNTHROID) 112 MCG tablet  ? ?Has the patient contacted their pharmacy? Yes.   ? ?Preferred Pharmacy: [id number] - [place], [place]  ?[place], [place] [address]  ?Phone:  [phone]  Fax:  [phone]  ? ? ?

## 2021-07-20 ENCOUNTER — Encounter: Payer: Self-pay | Admitting: Family Medicine

## 2021-08-27 ENCOUNTER — Other Ambulatory Visit: Payer: Self-pay

## 2021-08-27 DIAGNOSIS — E038 Other specified hypothyroidism: Secondary | ICD-10-CM

## 2021-08-27 MED ORDER — LEVOTHYROXINE SODIUM 112 MCG PO TABS: 112.0000 ug | ORAL_TABLET | Freq: Every day | ORAL | 3 refills | Status: DC

## 2022-02-04 ENCOUNTER — Ambulatory Visit: Payer: Managed Care, Other (non HMO) | Admitting: Family Medicine

## 2022-02-04 ENCOUNTER — Encounter: Payer: Self-pay | Admitting: Family Medicine

## 2022-02-04 VITALS — BP 124/78 | HR 89 | Temp 98.2°F | Ht 72.0 in | Wt 189.6 lb

## 2022-02-04 DIAGNOSIS — R21 Rash and other nonspecific skin eruption: Secondary | ICD-10-CM

## 2022-02-04 MED ORDER — TRIAMCINOLONE ACETONIDE 0.1 % EX OINT: 1.0000 | TOPICAL_OINTMENT | Freq: Two times a day (BID) | CUTANEOUS | 1 refills | Status: AC

## 2022-02-04 MED ORDER — FLUTICASONE PROPIONATE 50 MCG/ACT NA SUSP: 2.0000 | Freq: Every day | NASAL | 6 refills | Status: AC

## 2022-02-04 NOTE — Patient Instructions (Addendum)
Follow up as needed or as scheduled START the Triamcinolone ointment twice daily until itching and redness improves CONTINUE the Cetirizine daily to help w/ itching Call with any questions or concerns Stay Safe!  Stay Healthy! Happy Holidays!!!

## 2022-02-04 NOTE — Progress Notes (Signed)
   Subjective:    Patient ID: Gregory Webb, male    DOB: [birth year], 61 y.o.   MRN: [id number]  HPI Rash- 'my legs have been itching'.  Sxs started 4-5 days ago.  Has red bumps on L calf and just below R calf.  Mowed leaves last week on riding mower.  No new exposure to animals.  No one at home w/ similar.   Review of Systems For ROS see HPI     Objective:   Physical Exam Vitals reviewed.  Constitutional:      General: He is not in acute distress.    Appearance: Normal appearance. He is not ill-appearing.  HENT:     Head: Normocephalic and atraumatic.  Skin:    General: Skin is warm and dry.     Findings: Rash (maculopapular rash along his sock line of R lower leg and extending down to ankle, similar appearing area on L calf) present.  Neurological:     General: No focal deficit present.     Mental Status: He is alert and oriented to person, place, and time.  Psychiatric:        Mood and Affect: Mood normal.        Behavior: Behavior normal.        Thought Content: Thought content normal.           Assessment & Plan:  Rash- new.  Appears consistent w/ contact dermatitis but pt states no new socks or clothes and no change in detergent.  He mowed w/ jeans on but may have had something swirl up onto his legs while mowing.  Start topical Triamcinolone ointment and continue to use oral antihistamine daily.  Pt expressed understanding and is in agreement w/ plan.

## 2022-03-14 ENCOUNTER — Other Ambulatory Visit: Payer: Self-pay | Admitting: Internal Medicine

## 2022-03-14 DIAGNOSIS — K219 Gastro-esophageal reflux disease without esophagitis: Secondary | ICD-10-CM

## 2022-04-08 ENCOUNTER — Encounter: Payer: Self-pay | Admitting: Family Medicine

## 2022-04-08 ENCOUNTER — Ambulatory Visit: Payer: Managed Care, Other (non HMO) | Admitting: Family Medicine

## 2022-04-08 VITALS — BP 136/98 | HR 94 | Temp 98.6°F | Ht 72.0 in | Wt 192.0 lb

## 2022-04-08 DIAGNOSIS — R42 Dizziness and giddiness: Secondary | ICD-10-CM | POA: Diagnosis not present

## 2022-04-08 NOTE — Patient Instructions (Signed)
Follow up as scheduled- sooner if needed We'll notify you of your lab results and make any changes if needed INCREASE your water intake! The # for [facility] is [phone] Change positions SLOWLY!  Allow yourself time to adjust IF you have worsening dizziness, weakness, confusion, trouble w/ speech, or any other concerns- go to the ER Call with any questions or concerns Hang in there!!

## 2022-04-08 NOTE — Progress Notes (Signed)
   Subjective:    Patient ID: Gregory Webb, male    DOB: 1960/06/12, 62 y.o.   MRN: [id number]  HPI Dizziness- pt reports he was at his desk working on Tuesday when he stood up quickly and 'got real dizzy'.  Sxs lasted 3-4 minutes.  Afterwards had nausea and felt weak.  Continues to have mild nausea- improves w/ eating.  L arm and hand as well as L leg feel weak.  Does not feel that leg is going to give out or that he will drop something w/ L hand.  Home BP this morning was 141/103 and HR 113- pt had been outside 'getting stuff done'.  Was also in the 140s at [employer] physical a few weeks ago.  Not great water intake.  No hx of similar.  Denies visual changes.  No change in speech or facial droop.  No change in eating habits.  + HA   Review of Systems For ROS see HPI     Objective:   Physical Exam Vitals reviewed.  Constitutional:      General: He is not in acute distress.    Appearance: Normal appearance. He is well-developed.  HENT:     Head: Normocephalic and atraumatic.  Eyes:     Extraocular Movements: Extraocular movements intact.     Conjunctiva/sclera: Conjunctivae normal.     Pupils: Pupils are equal, round, and reactive to light.  Neck:     Thyroid: No thyromegaly.  Cardiovascular:     Rate and Rhythm: Normal rate and regular rhythm.     Pulses: Normal pulses.     Heart sounds: Normal heart sounds. No murmur heard. Pulmonary:     Effort: Pulmonary effort is normal. No respiratory distress.     Breath sounds: Normal breath sounds.  Abdominal:     General: Bowel sounds are normal. There is no distension.     Palpations: Abdomen is soft.  Musculoskeletal:     Cervical back: Normal range of motion and neck supple.     Right lower leg: No edema.     Left lower leg: No edema.  Lymphadenopathy:     Cervical: No cervical adenopathy.  Skin:    General: Skin is warm and dry.  Neurological:     General: No focal deficit present.     Mental Status: He is alert and oriented to  person, place, and time.     Cranial Nerves: No cranial nerve deficit.  Psychiatric:        Mood and Affect: Mood normal.        Behavior: Behavior normal.           Assessment & Plan:   Dizziness- new.  EKG WNL.  Suspect sxs are due to dehydration and encouraged him to increase his water intake.  Will check labs to r/o metabolic cause of dizziness.  Encouraged him to change positions slowly.  Given L arm and leg weakness will get head CT to assess for small CVA.  Reviewed supportive care and red flags that should prompt return.  Pt expressed understanding and is in agreement w/ plan.

## 2022-04-09 LAB — CBC WITH DIFFERENTIAL/PLATELET
Absolute Monocytes: 524 cells/uL (ref 200–950)
Basophils Absolute: 62 cells/uL (ref 0–200)
Basophils Relative: 0.9 %
Eosinophils Absolute: 242 cells/uL (ref 15–500)
Eosinophils Relative: 3.5 %
HCT: 38.3 % — ABNORMAL LOW (ref 38.5–50.0)
Hemoglobin: 13.4 g/dL (ref 13.2–17.1)
Lymphs Abs: 1083 cells/uL (ref 850–3900)
MCH: 29.6 pg (ref 27.0–33.0)
MCHC: 35 g/dL (ref 32.0–36.0)
MCV: 84.7 fL (ref 80.0–100.0)
MPV: 10.1 fL (ref 7.5–12.5)
Monocytes Relative: 7.6 %
Neutro Abs: 4989 cells/uL (ref 1500–7800)
Neutrophils Relative %: 72.3 %
Platelets: 514 10*3/uL — ABNORMAL HIGH (ref 140–400)
RBC: 4.52 10*6/uL (ref 4.20–5.80)
RDW: 13.2 % (ref 11.0–15.0)
Total Lymphocyte: 15.7 %
WBC: 6.9 10*3/uL (ref 3.8–10.8)

## 2022-04-09 LAB — HEPATIC FUNCTION PANEL
AG Ratio: 1.7 (calc) (ref 1.0–2.5)
ALT: 16 U/L (ref 9–46)
AST: 19 U/L (ref 10–35)
Albumin: 4.6 g/dL (ref 3.6–5.1)
Alkaline phosphatase (APISO): 72 U/L (ref 35–144)
Bilirubin, Direct: 0.1 mg/dL (ref 0.0–0.2)
Globulin: 2.7 g/dL (calc) (ref 1.9–3.7)
Indirect Bilirubin: 0.5 mg/dL (calc) (ref 0.2–1.2)
Total Bilirubin: 0.6 mg/dL (ref 0.2–1.2)
Total Protein: 7.3 g/dL (ref 6.1–8.1)

## 2022-04-09 LAB — BASIC METABOLIC PANEL
BUN/Creatinine Ratio: 20 (calc) (ref 6–22)
BUN: 28 mg/dL — ABNORMAL HIGH (ref 7–25)
CO2: 26 mmol/L (ref 20–32)
Calcium: 9.9 mg/dL (ref 8.6–10.3)
Chloride: 108 mmol/L (ref 98–110)
Creat: 1.38 mg/dL — ABNORMAL HIGH (ref 0.70–1.35)
Glucose, Bld: 92 mg/dL (ref 65–99)
Potassium: 4.5 mmol/L (ref 3.5–5.3)
Sodium: 142 mmol/L (ref 135–146)

## 2022-04-09 LAB — TSH: TSH: 3.8 mIU/L (ref 0.40–4.50)

## 2022-04-11 ENCOUNTER — Telehealth: Payer: Self-pay

## 2022-04-11 NOTE — Telephone Encounter (Signed)
Left lab results on VM called pt once and he answered and hung up .

## 2022-04-11 NOTE — Telephone Encounter (Signed)
-----   Message from Midge Minium, MD sent at 04/11/2022  7:37 AM EST ----- Your labs show me that you are a bit dehydrated and you need to increase your water intake.  Remainder of labs look great!

## 2022-04-28 ENCOUNTER — Ambulatory Visit (INDEPENDENT_AMBULATORY_CARE_PROVIDER_SITE_OTHER): Payer: Managed Care, Other (non HMO) | Admitting: Family Medicine

## 2022-04-28 ENCOUNTER — Encounter: Payer: Self-pay | Admitting: Family Medicine

## 2022-04-28 VITALS — BP 122/80 | HR 61 | Temp 97.5°F | Resp 17 | Ht 73.0 in | Wt 195.2 lb

## 2022-04-28 DIAGNOSIS — Z0000 Encounter for general adult medical examination without abnormal findings: Secondary | ICD-10-CM | POA: Diagnosis not present

## 2022-04-28 DIAGNOSIS — E785 Hyperlipidemia, unspecified: Secondary | ICD-10-CM | POA: Diagnosis not present

## 2022-04-28 NOTE — Patient Instructions (Signed)
Schedule your lab visit at your convenience Follow up in 1 year or as needed Keep up the good work on healthy diet and regular exercise- you look great!!! Call with any questions or concerns Stay Safe!  Stay Healthy! Happy Spring!!!

## 2022-04-28 NOTE — Assessment & Plan Note (Signed)
Pt's PE WNL.  UTD on colonoscopy, Tdap.  Check labs.  Anticipatory guidance provided.  

## 2022-04-28 NOTE — Assessment & Plan Note (Signed)
Due for labs and determine if medication is needed

## 2022-04-28 NOTE — Progress Notes (Signed)
   Subjective:    Patient ID: Gregory Webb, male    DOB: 1960/10/13, 62 y.o.   MRN: [id number]  HPI CPE- UTD on colonoscopy, Tdap  Patient Care Team    Relationship Specialty Notifications Start End  Midge Minium, MD PCP - General Family Medicine  05/07/14   Gatha Mayer, MD Consulting Physician Gastroenterology  02/02/15   Myrlene Broker, MD Attending Physician Urology  11/02/16      Health Maintenance  Topic Date Due   Zoster Vaccines- Shingrix (1 of 2) 05/06/2022 (Originally 01/07/2010)   INFLUENZA VACCINE  06/05/2022 (Originally 10/05/2021)   COLONOSCOPY (Pts 45-70yr Insurance coverage will need to be confirmed)  09/28/2025   DTaP/Tdap/Td (3 - Td or Tdap) 01/14/2029   Hepatitis C Screening  Completed   HIV Screening  Completed   HPV VACCINES  Aged Out   COVID-19 Vaccine  Discontinued      Review of Systems Patient reports no vision/hearing changes, anorexia, fever ,adenopathy, persistant/recurrent hoarseness, swallowing issues, chest pain, palpitations, edema, persistant/recurrent cough, hemoptysis, dyspnea (rest,exertional, paroxysmal nocturnal), gastrointestinal  bleeding (melena, rectal bleeding), abdominal pain, excessive heart burn, GU symptoms (dysuria, hematuria, voiding/incontinence issues) syncope, focal weakness, memory loss, numbness & tingling, skin/hair/nail changes, depression, anxiety, abnormal bruising/bleeding, musculoskeletal symptoms/signs.     Objective:   Physical Exam General Appearance:    Alert, cooperative, no distress, appears stated age  Head:    Normocephalic, without obvious abnormality, atraumatic  Eyes:    PERRL, conjunctiva/corneas clear, EOM's intact both eyes       Ears:    Normal TM's and external ear canals, both ears  Nose:   Nares normal, septum midline, mucosa normal, no drainage   or sinus tenderness  Throat:   Lips, mucosa, and tongue normal; teeth and gums normal  Neck:   Supple, symmetrical, trachea midline, no  adenopathy;       thyroid:  No enlargement/tenderness/nodules  Back:     Symmetric, no curvature, ROM normal, no CVA tenderness  Lungs:     Clear to auscultation bilaterally, respirations unlabored  Chest wall:    No tenderness or deformity  Heart:    Regular rate and rhythm, S1 and S2 normal, no murmur, rub   or gallop  Abdomen:     Soft, non-tender, bowel sounds active all four quadrants,    no masses, no organomegaly  Genitalia:    Deferred to urology  Rectal:    Extremities:   Extremities normal, atraumatic, no cyanosis or edema  Pulses:   2+ and symmetric all extremities  Skin:   Skin color, texture, turgor normal, no rashes or lesions  Lymph nodes:   Cervical, supraclavicular, and axillary nodes normal  Neurologic:   CNII-XII intact. Normal strength, sensation and reflexes      throughout          Assessment & Plan:

## 2022-06-20 ENCOUNTER — Other Ambulatory Visit: Payer: Self-pay

## 2022-06-20 NOTE — Telephone Encounter (Signed)
We have not refilled this since 2022 can we refill ?

## 2022-06-21 ENCOUNTER — Other Ambulatory Visit: Payer: Self-pay

## 2022-06-21 DIAGNOSIS — E038 Other specified hypothyroidism: Secondary | ICD-10-CM

## 2022-06-21 MED ORDER — IPRATROPIUM-ALBUTEROL 20-100 MCG/ACT IN AERS: INHALATION_SPRAY | RESPIRATORY_TRACT | 6 refills | Status: AC

## 2022-06-21 MED ORDER — LEVOTHYROXINE SODIUM 112 MCG PO TABS: 112.0000 ug | ORAL_TABLET | Freq: Every day | ORAL | 6 refills | Status: DC

## 2022-06-26 ENCOUNTER — Other Ambulatory Visit: Payer: Self-pay | Admitting: Internal Medicine

## 2022-06-26 DIAGNOSIS — K219 Gastro-esophageal reflux disease without esophagitis: Secondary | ICD-10-CM

## 2022-09-22 ENCOUNTER — Encounter: Payer: Self-pay | Admitting: Family Medicine

## 2022-09-22 ENCOUNTER — Ambulatory Visit: Payer: Managed Care, Other (non HMO) | Admitting: Family Medicine

## 2022-09-22 VITALS — BP 122/80 | HR 60 | Temp 97.8°F | Resp 17 | Ht 73.0 in | Wt 188.2 lb

## 2022-09-22 DIAGNOSIS — H8110 Benign paroxysmal vertigo, unspecified ear: Secondary | ICD-10-CM

## 2022-09-22 DIAGNOSIS — J3489 Other specified disorders of nose and nasal sinuses: Secondary | ICD-10-CM | POA: Diagnosis not present

## 2022-09-22 MED ORDER — MECLIZINE HCL 25 MG PO TABS: 25.0000 mg | ORAL_TABLET | Freq: Three times a day (TID) | ORAL | 0 refills | Status: DC | PRN

## 2022-09-22 NOTE — Patient Instructions (Signed)
Follow up as needed or as scheduled INCREASE your water intake START a daily Claritin or Zyrtec to help w/ pressure and congestion USE the Meclizine as needed for dizziness Call with any questions or concerns Hang in there!

## 2022-09-22 NOTE — Progress Notes (Signed)
   Subjective:    Patient ID: Gregory Webb, male    DOB: July 01, 1960, 62 y.o.   MRN: [id number]  HPI Dizziness- pt reports 2.5 weeks ago was at work and turned his head quickly which caused him to get dizzy.  Came back inside, drank water, and within 10 minutes felt better.  Has not had additional sxs.  Sinus pressure- pt reports he has had sinus pressure, particularly R frontal.  Took OTC sinus meds last [place] while [place] the beach and these were not helpful.  On Saturday went to [facility] and was told no infection.  Not taking any claritin or zyrtec.   Review of Systems     Objective:   Physical Exam Vitals reviewed.  Constitutional:      General: He is not in acute distress.    Appearance: He is well-developed.  HENT:     Head: Normocephalic and atraumatic.     Right Ear: Tympanic membrane and ear canal normal.     Left Ear: Tympanic membrane and ear canal normal.     Nose: Nose normal. No congestion or rhinorrhea.     Comments: No TTP over frontal or maxillary sinuses Eyes:     Extraocular Movements: Extraocular movements intact.     Conjunctiva/sclera: Conjunctivae normal.     Pupils: Pupils are equal, round, and reactive to light.     Comments: No nystagmus  Cardiovascular:     Rate and Rhythm: Normal rate and regular rhythm.     Heart sounds: Normal heart sounds.  Pulmonary:     Effort: Pulmonary effort is normal. No respiratory distress.     Breath sounds: Normal breath sounds. No wheezing.  Musculoskeletal:     Cervical back: Normal range of motion and neck supple.  Lymphadenopathy:     Cervical: No cervical adenopathy.  Skin:    General: Skin is warm and dry.  Neurological:     General: No focal deficit present.     Mental Status: He is oriented to person, place, and time.     Coordination: Coordination normal.     Gait: Gait normal.  Psychiatric:        Mood and Affect: Mood normal.        Behavior: Behavior normal.        Thought Content: Thought content normal.            Assessment & Plan:  Sinus pressure- new.  No evidence of infection on hx or PE.  Encouraged daily antihistamine to improve congestion and pressure.  Pt expressed understanding and is in agreement w/ plan.   BPV- new.  Pt's episode at work sounds consistent w/ BPV.  He was also likely dehydrated at the time.  Stressed need for increased water intake.  He doesn't have any nystagmus on exam and is asymptomatic today.  Meclizine as needed.  Reviewed supportive care and red flags that should prompt return.  Pt expressed understanding and is in agreement w/ plan.

## 2022-09-27 ENCOUNTER — Telehealth: Payer: Self-pay

## 2022-09-27 NOTE — Telephone Encounter (Signed)
Called for TOC appt no answer LM to call back and schedule  Pt was discharged from hospital/ED 09/25/2022 for headache

## 2022-09-29 ENCOUNTER — Telehealth: Payer: Self-pay

## 2022-09-29 NOTE — Transitions of Care (Post Inpatient/ED Visit) (Signed)
   09/29/2022  Name: Gregory Webb MRN: [id number] DOB: August 09, 1960  Today's TOC FU Call Status: Today's TOC FU Call Status:: Successful TOC FU Call Competed TOC FU Call Complete Date: 09/29/22  Transition Care Management Follow-up Telephone Call Date of Discharge: 09/24/22 Discharge Facility: Other ([facility]) Name of Other ([facility]) Discharge Facility: [facility] Type of Discharge: Emergency Department Reason for ED Visit: Other: How have you been since you were released from the hospital?: Better Any questions or concerns?: No  Items Reviewed: Did you receive and understand the discharge instructions provided?: Yes Medications obtained,verified, and reconciled?: Yes (Medications Reviewed) Any new allergies since your discharge?: No Dietary orders reviewed?: No Do you have support at home?: Yes People in Home: child(ren), adult Name of Support/Comfort Primary Source: [place]  Medications Reviewed Today: Medications Reviewed Today     Reviewed by Lenard Simmer, CMA (Certified Medical Assistant) on 09/29/22 at 1000  Med List Status: <None>   Medication Order Taking? Sig Documenting Provider Last Dose Status Informant  albuterol (VENTOLIN HFA) 108 (90 Base) MCG/ACT inhaler [id number] Yes Inhale 2 puffs into the lungs every 4 (four) hours as needed for wheezing or shortness of breath. Sheliah Hatch, MD Taking Active   cetirizine (ZYRTEC) 10 MG tablet [id number] Yes Take 1 tablet (10 mg total) by mouth daily. Sheliah Hatch, MD Taking Active   cyclobenzaprine (FLEXERIL) 10 MG tablet [id number] No Take 0.5-1 tablets (5-10 mg total) by mouth 3 (three) times daily as needed for muscle spasms.  Patient not taking: Reported on 09/29/2022   Janeece Agee, NP Not Taking Active   fluticasone [facility]) 50 MCG/ACT nasal spray [id number] Yes Place 2 sprays into both nostrils daily. Sheliah Hatch, MD Taking Active   Ipratropium-Albuterol (COMBIVENT) 20-100 MCG/ACT AERS  respimat [id number] Yes Please use 1 puff every 4-6 hours as needed. Sheliah Hatch, MD Taking Active   levothyroxine (SYNTHROID) 112 MCG tablet [id number] Yes Take 1 tablet (112 mcg total) by mouth daily. Sheliah Hatch, MD Taking Active   meclizine (ANTIVERT) 25 MG tablet [id number] No Take 1 tablet (25 mg total) by mouth 3 (three) times daily as needed for dizziness.  Patient not taking: Reported on 09/29/2022   Sheliah Hatch, MD Not Taking Active   omeprazole (PRILOSEC) 20 MG capsule [id number] Yes TAKE 1 CAPSULE BY MOUTH DAILY Iva Boop, MD Taking Active   triamcinolone ointment (KENALOG) 0.1 % [id number] No Apply 1 Application topically 2 (two) times daily.  Patient not taking: Reported on 09/22/2022   Sheliah Hatch, MD Not Taking Active             Home Care and Equipment/Supplies: Were Home Health Services Ordered?: No Any new equipment or medical supplies ordered?: No  Functional Questionnaire: Do you need assistance with bathing/showering or dressing?: No Do you need assistance with meal preparation?: No Do you need assistance with eating?: No Do you have difficulty maintaining continence: No Do you need assistance with getting out of bed/getting out of a chair/moving?: No Do you have difficulty managing or taking your medications?: No  Follow up appointments reviewed: PCP Follow-up appointment confirmed?: Yes Date of PCP follow-up appointment?: 10/05/22 Follow-up Provider: [facility] Follow-up appointment confirmed?: NA Do you need transportation to your follow-up appointment?: No Do you understand care options if your condition(s) worsen?: Yes-patient verbalized understanding    SIGNATURE Lenard Simmer, CMA

## 2022-10-05 ENCOUNTER — Inpatient Hospital Stay: Payer: Managed Care, Other (non HMO) | Admitting: Family Medicine

## 2022-10-13 ENCOUNTER — Ambulatory Visit: Payer: Managed Care, Other (non HMO) | Admitting: Family Medicine

## 2022-10-13 ENCOUNTER — Encounter: Payer: Self-pay | Admitting: Family Medicine

## 2022-10-13 VITALS — BP 108/80 | HR 68 | Temp 98.1°F | Resp 18 | Ht 72.0 in | Wt 190.0 lb

## 2022-10-13 DIAGNOSIS — R519 Headache, unspecified: Secondary | ICD-10-CM | POA: Diagnosis not present

## 2022-10-13 DIAGNOSIS — R42 Dizziness and giddiness: Secondary | ICD-10-CM

## 2022-10-13 MED ORDER — SUMATRIPTAN SUCCINATE 50 MG PO TABS: 50.0000 mg | ORAL_TABLET | ORAL | 0 refills | Status: DC | PRN

## 2022-10-13 NOTE — Patient Instructions (Signed)
Follow up as needed or as scheduled Make sure you are drinking LOTS of water Make sure you are eating regularly!!  Your body needs the fuel! At the first sign of headache or dizziness, take a Sumatriptan and see if things improve You can still use Tylenol and Ibuprofen as needed We'll call you to schedule your neurology appt Call with any questions or concerns Hang in there!!!

## 2022-10-13 NOTE — Progress Notes (Signed)
   Subjective:    Patient ID: Gregory Webb, male    DOB: [birth year], 62 y.o.   MRN: [id number]  HPI ER f/u- pt went to [facility] on 7/20 for headache and dizziness.  HA is frontal.  Has not had radiation of pain into face like he did the day he went to the ER.  Had 1 episode last week and 1 episode this week.  No associated nausea.  Did have associated dizziness w/ visual changes that resolved in 7-8 minutes.  HA remains after dizziness improves.  Denies photo or phonophobia.  HA improves w/ tylenol or ibuprofen and typically takes a day to resolve.  CT scan in ER was normal.  Labs also normal.  + family hx of migraines.  These episodes started in Feb but no hx of issues prior.  Pt is asymptomatic today.   Review of Systems For ROS see HPI     Objective:   Physical Exam Vitals reviewed.  Constitutional:      General: He is not in acute distress.    Appearance: Normal appearance. He is well-developed. He is not ill-appearing.  HENT:     Head: Normocephalic and atraumatic.  Eyes:     Conjunctiva/sclera: Conjunctivae normal.     Pupils: Pupils are equal, round, and reactive to light.  Cardiovascular:     Rate and Rhythm: Normal rate and regular rhythm.     Heart sounds: Normal heart sounds.  Pulmonary:     Effort: Pulmonary effort is normal. No respiratory distress.     Breath sounds: Normal breath sounds.  Musculoskeletal:     Cervical back: Normal range of motion and neck supple.  Lymphadenopathy:     Cervical: No cervical adenopathy.  Skin:    General: Skin is warm and dry.  Neurological:     General: No focal deficit present.     Mental Status: He is alert and oriented to person, place, and time.     Cranial Nerves: No cranial nerve deficit.     Coordination: Coordination normal.     Gait: Gait normal.  Psychiatric:        Mood and Affect: Mood normal.        Behavior: Behavior normal.        Thought Content: Thought content normal.           Assessment & Plan:   Dizziness/frontal HA- ongoing issue.  Pt's first episode was in February.  He has had multiple episodes since.  Upon listening to him describe the episodes again today, it sounds like possible migraine w/ aura.  He says he gets visual changes and dizziness for 7-10 minutes that resolves and the frontal headache remains for ~1 day.  Will start Sumatriptan to take at onset of sxs to see if things improve.  Will refer to Neuro for complete evaluation.  Pt expressed understanding and is in agreement w/ plan.

## 2022-11-13 ENCOUNTER — Other Ambulatory Visit: Payer: Self-pay | Admitting: Internal Medicine

## 2022-11-13 DIAGNOSIS — K219 Gastro-esophageal reflux disease without esophagitis: Secondary | ICD-10-CM

## 2022-12-30 ENCOUNTER — Ambulatory Visit: Payer: Managed Care, Other (non HMO) | Admitting: Neurology

## 2022-12-30 ENCOUNTER — Encounter: Payer: Self-pay | Admitting: Neurology

## 2022-12-30 VITALS — BP 140/86 | HR 57 | Ht 72.0 in | Wt 190.0 lb

## 2022-12-30 DIAGNOSIS — G44221 Chronic tension-type headache, intractable: Secondary | ICD-10-CM

## 2022-12-30 MED ORDER — METHYLPREDNISOLONE 4 MG PO TBPK: ORAL_TABLET | ORAL | 0 refills | Status: AC

## 2022-12-30 MED ORDER — AMITRIPTYLINE HCL 25 MG PO TABS: 25.0000 mg | ORAL_TABLET | Freq: Every day | ORAL | 6 refills | Status: DC

## 2022-12-30 NOTE — Progress Notes (Signed)
[facility]  PATIENT: Gregory Webb DOB: Dec 08, 1960  REQUESTING CLINICIAN: Sheliah Hatch, MD HISTORY FROM: Patient and spouse  REASON FOR VISIT: New onset headaches    HISTORICAL  CHIEF COMPLAINT:  Chief Complaint  Patient presents with   Room 12    Pt is here with her Wife. Pt states that when he has the headache and dizziness he gets nauseous. Pt states that when he has his Dizziness it last for 5 mins and he has sweating and then he will get his headaches. Pt states that when he has his dizzy episodes he sees floaters. Pt states that his headaches is kind of like sinus pressure. Pt states that he gets 5-6 headaches per week that can last.  Pt states when he gets his headaches his arms start tingling more in his left arm. Strokes run in pt's fami    HISTORY OF PRESENT ILLNESS:  This is 62 year old gentleman past medical history of hypothyroidism, GERD, anxiety who is presenting with complaints of headaches for the past 6 to 8 months.  Patient reports in the past 6 to 8 months, he was having occasional headaches, bifrontal, but lately in the past 8 weeks, the headaches become constant.  He will have the headaches every day mainly after waking up.  Headaches are bifrontal, in addition he described them as pressure they vary between 3-8 in intensity.  With the headaches he also have nausea and left arm numbness, denies any weakness, and he reports the left arm numbness started about 6 weeks ago and it is constant.  Reports that Tylenol and ibuprofen do help.  Denies any previous history of headaches. Wife tells me that lately patient is under a lot of stress, he does take care of both parents who are 16 and 5 and sister was recently diagnosed with cancer.   Headache History and Characteristics: Onset: 6 months ago  Location: Bifrontal Quality:  pressure  Intensity: 3-8/10.  Duration: Constant headaches for the past 8 weeks  Migrainous Features: nausea.   Aura: No  History of brain injury or tumor: No  OTC: tylenol, codeine Sleep: not good, works 3rd shift   Prior prophylaxis: Propranolol: No  Verapamil:No TCA: No Topamax: No Depakote: No Effexor: No Cymbalta: No Neurontin:No  Prior abortives: Triptan: No Anti-emetic: No Steroids: No Ergotamine suppository: No   OTHER MEDICAL CONDITIONS: Hypothyroidism, GERD, Anxiety    REVIEW OF SYSTEMS: Full 14 system review of systems performed and negative with exception of: As noted in the HPI   ALLERGIES: No Known Allergies  HOME MEDICATIONS: Outpatient Medications Prior to Visit  Medication Sig Dispense Refill   albuterol (VENTOLIN HFA) 108 (90 Base) MCG/ACT inhaler Inhale 2 puffs into the lungs every 4 (four) hours as needed for wheezing or shortness of breath. 8 g 3   cetirizine (ZYRTEC) 10 MG tablet Take 1 tablet (10 mg total) by mouth daily. 30 tablet 11   fluticasone (FLONASE) 50 MCG/ACT nasal spray Place 2 sprays into both nostrils daily. 16 g 6   Ipratropium-Albuterol (COMBIVENT) 20-100 MCG/ACT AERS respimat Please use 1 puff every 4-6 hours as needed. 4 g 6   levothyroxine (SYNTHROID) 112 MCG tablet Take 1 tablet (112 mcg total) by mouth daily. 30 tablet 6   omeprazole (PRILOSEC) 20 MG capsule TAKE ONE CAPSULE BY MOUTH DAILY 90 capsule 0   triamcinolone ointment (KENALOG) 0.1 % Apply 1 Application topically 2 (two) times daily. 453 g 1   cyclobenzaprine (FLEXERIL) 10 MG tablet  Take 0.5-1 tablets (5-10 mg total) by mouth 3 (three) times daily as needed for muscle spasms. (Patient not taking: Reported on 12/30/2022) 30 tablet 0   meclizine (ANTIVERT) 25 MG tablet Take 1 tablet (25 mg total) by mouth 3 (three) times daily as needed for dizziness. (Patient not taking: Reported on 10/13/2022) 45 tablet 0   SUMAtriptan (IMITREX) 50 MG tablet Take 1 tablet (50 mg total) by mouth every 2 (two) hours as needed for migraine. May repeat in 2 hours if headache persists or recurs. (Patient  not taking: Reported on 12/30/2022) 10 tablet 0   No facility-administered medications prior to visit.    PAST MEDICAL HISTORY: Past Medical History:  Diagnosis Date   Adenomatous colon polyp    Allergic rhinitis    Asthma    prn Combivent seasonal    Diverticulosis    GERD (gastroesophageal reflux disease)    Hemorrhoids    Hyperlipidemia 08/2009   LDL 128, HDL 49.40,TG 78. No premature CAD/MI in FH   Hypothyroidism    Low back pain syndrome    Lower esophageal ring 06/2010   dilated   Prostatitis    X3; Dr Darvin Neighbours    PAST SURGICAL HISTORY: Past Surgical History:  Procedure Laterality Date   COLONOSCOPY  06/08/2010   1 adenoma, mild diverticulosis and hemorrhoids plan for repeat 2017 approximately   INGUINAL HERNIA REPAIR Bilateral 04/03/2018   Procedure: LAPAROSCOPIC BILATERAL INGUINAL HERNIA REPAIR WITH MESH ERAS PATHWAY;  Surgeon: Luretha Murphy, MD;  Location: [facility];  Service: General;  Laterality: Bilateral;   SHOULDER SURGERY  2008   right   SHOULDER SURGERY Left 2018   UPPER GASTROINTESTINAL ENDOSCOPY  06/08/2010   Lower esophageal ring, dilated to 50 [place]. Mild gastritis, thoracic inlet patch.    FAMILY HISTORY: Family History  Problem Relation Age of Onset   Breast cancer Mother    Hyperlipidemia Mother    Melanoma Mother    Stroke Maternal Grandmother        late 76s   Stroke Maternal Grandfather        late 28s   Colon cancer Paternal Grandmother    Heart attack Paternal Grandfather        in 4s   Diabetes Neg Hx    Pancreatic cancer Neg Hx    Stomach cancer Neg Hx     SOCIAL HISTORY: Social History   Socioeconomic History   Marital status: Married    Spouse name: Not on file   Number of children: 2   Years of education: Not on file   Highest education level: Not on file  Occupational History    Employer: [employer]  Tobacco Use   Smoking status: Former    Current packs/day: 0.00    Types: Cigarettes    Quit date: 06/06/1980     Years since quitting: 42.5   Smokeless tobacco: Former   Tobacco comments:    smoked [age] , up to 1 pp WEEK  Vaping Use   Vaping status: Never Used  Substance and Sexual Activity   Alcohol use: Yes    Alcohol/week: 3.0 - 4.0 standard drinks of alcohol    Types: 3 - 4 Standard drinks or equivalent per week    Comment: 2-3 beers per week    Drug use: No   Sexual activity: Not on file  Other Topics Concern   Not on file  Social History Narrative   Patient is married 2 daughters that are grown  2 caffeinated beverages daily   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 Wt Readings from Last 3 Encounters:  12/30/22 190 lb (86.2 kg)  10/13/22 190 lb (86.2 kg)  09/22/22 188 lb 4 oz (85.4 kg)   Patient is in no distress; well developed, nourished and groomed; neck is supple  MUSCULOSKELETAL: Gait, strength, tone, movements noted in Neurologic exam below  NEUROLOGIC: MENTAL STATUS:      No data to display         awake, alert, oriented to person, place and time recent and remote memory intact normal attention and concentration language fluent, comprehension intact, naming intact fund of knowledge appropriate  CRANIAL NERVE:  2nd - no papilledema or hemorrhages on fundoscopic exam 2nd, 3rd, 4th, 6th - pupils equal and reactive to light, visual fields full to confrontation, extraocular muscles intact, no nystagmus 5th - facial sensation symmetric 7th - facial strength  symmetric 8th - hearing intact 9th - palate elevates symmetrically, uvula midline 11th - shoulder shrug symmetric 12th - tongue protrusion midline  MOTOR:  normal bulk and tone, full strength in the BUE, BLE  SENSORY:  normal and symmetric to light touch  COORDINATION:  finger-nose-finger, fine finger movements normal  GAIT/STATION:  normal   DIAGNOSTIC DATA (LABS, IMAGING, TESTING) - I reviewed patient records, labs, notes, testing and imaging myself where available.  Lab Results  Component Value Date   WBC 6.9 04/08/2022   HGB 13.4 04/08/2022   HCT 38.3 (L) 04/08/2022   MCV 84.7 04/08/2022   PLT 514 (H) 04/08/2022      Component Value Date/Time   NA 142 04/08/2022 1541   K 4.5 04/08/2022 1541   CL 108 04/08/2022 1541   CO2 26 04/08/2022 1541   GLUCOSE 92 04/08/2022 1541   BUN 28 (H) 04/08/2022 1541   CREATININE 1.38 (H) 04/08/2022 1541   CALCIUM 9.9 04/08/2022 1541   PROT 7.3 04/08/2022 1541   ALBUMIN 4.4 04/22/2021 0803   AST 19 04/08/2022 1541   ALT 16 04/08/2022 1541   ALKPHOS 72 04/22/2021 0803   BILITOT 0.6 04/08/2022 1541   GFRNONAA >60 01/30/2015 1035   GFRAA >60 01/30/2015 1035   Lab Results  Component Value Date   CHOL 189 04/22/2021   HDL 40.50 04/22/2021   LDLCALC 127 (H) 04/22/2021   TRIG 107.0 04/22/2021   CHOLHDL 5 04/22/2021   No results found for: "HGBA1C" No results found for: "VITAMINB12" Lab Results  Component Value Date   TSH 3.80 04/08/2022    ASSESSMENT AND PLAN  62 y.o. year old male with history of hypothyroidism, GERD, anxiety who is presenting with new onset of headache for the past 73-months and constant in the past 8 weeks with left arm numbness, no weakness.  Headaches are not better with over-the-counter medications.  Plan will be to obtain a MRI brain to rule out secondary causes of headaches but if MRI is  negative then we will treat this as chronic tension headache.  For now I will also start him on a Medrol Dosepak  and amitriptyline at night.  I will contact him after the completion of the MRI but he does understand to contact me if the headaches are not getting better or if he has any new symptoms.   1. Chronic tension-type headache, intractable     Patient Instructions  MRI brain with and without contrast Will start Medrol Dosepak Will also start the patient on amitriptyline nightly Continue with Tylenol or Aleve as needed for the headaches Please do contact me if the headaches do get worse. Follow-up in 6 months or sooner if worse.   Orders Placed This Encounter  Procedures   MR BRAIN W WO CONTRAST    Meds ordered this encounter  Medications   methylPREDNISolone (MEDROL DOSEPAK) 4 MG TBPK tablet    Sig: Take 6 tablets (24 mg total) by mouth daily for 1 day, THEN 5 tablets (20 mg total) daily for 1 day, THEN 4 tablets (16 mg total) daily for 1 day, THEN 3 tablets (12 mg total) daily for 1 day, THEN 2 tablets (8 mg total) daily for 1 day, THEN 1 tablet (4 mg total) daily for 1 day.    Dispense:  21 tablet    Refill:  0   amitriptyline (ELAVIL) 25 MG tablet    Sig: Take 1 tablet (25 mg total) by mouth at bedtime.    Dispense:  30 tablet    Refill:  6    Return in about 6 months (around 06/30/2023).    Windell Norfolk, MD 12/30/2022, 11:41 AM  [facility] [address] [place], [place] [address] [phone]

## 2022-12-30 NOTE — Patient Instructions (Signed)
MRI brain with and without contrast Will start Medrol Dosepak Will also start the patient on amitriptyline nightly Continue with Tylenol or Aleve as needed for the headaches Please do contact me if the headaches do get worse. Follow-up in 6 months or sooner if worse.

## 2023-01-02 ENCOUNTER — Telehealth: Payer: Self-pay | Admitting: Neurology

## 2023-01-02 NOTE — Telephone Encounter (Signed)
sent to GI they obtain [employer] auth [phone] 

## 2023-01-19 ENCOUNTER — Ambulatory Visit
Admission: RE | Admit: 2023-01-19 | Discharge: 2023-01-19 | Disposition: A | Discharge status: Home / Self Care | Payer: Managed Care, Other (non HMO) | Source: Ambulatory Visit | Attending: Neurology | Admitting: Neurology

## 2023-01-19 DIAGNOSIS — G44221 Chronic tension-type headache, intractable: Secondary | ICD-10-CM | POA: Diagnosis not present

## 2023-01-19 MED ORDER — GADOPICLENOL 0.5 MMOL/ML IV SOLN
9.0000 mL | Freq: Once | INTRAVENOUS | Status: AC | PRN
  Administered 2023-01-19: 9 mL via INTRAVENOUS

## 2023-03-01 ENCOUNTER — Other Ambulatory Visit: Payer: Self-pay | Admitting: Internal Medicine

## 2023-03-01 DIAGNOSIS — K219 Gastro-esophageal reflux disease without esophagitis: Secondary | ICD-10-CM

## 2023-05-02 DIAGNOSIS — R972 Elevated prostate specific antigen [PSA]: Secondary | ICD-10-CM | POA: Insufficient documentation

## 2023-05-05 ENCOUNTER — Ambulatory Visit (INDEPENDENT_AMBULATORY_CARE_PROVIDER_SITE_OTHER): Payer: Managed Care, Other (non HMO) | Admitting: Family Medicine

## 2023-05-05 ENCOUNTER — Encounter: Payer: Self-pay | Admitting: Family Medicine

## 2023-05-05 VITALS — BP 128/72 | HR 66 | Temp 97.5°F | Ht 72.0 in | Wt 203.5 lb

## 2023-05-05 DIAGNOSIS — L930 Discoid lupus erythematosus: Secondary | ICD-10-CM | POA: Diagnosis not present

## 2023-05-05 DIAGNOSIS — E038 Other specified hypothyroidism: Secondary | ICD-10-CM

## 2023-05-05 DIAGNOSIS — Z0000 Encounter for general adult medical examination without abnormal findings: Secondary | ICD-10-CM

## 2023-05-05 LAB — CBC WITH DIFFERENTIAL/PLATELET
Basophils Absolute: 0.1 10*3/uL (ref 0.0–0.1)
Basophils Relative: 1.2 % (ref 0.0–3.0)
Eosinophils Absolute: 0.3 10*3/uL (ref 0.0–0.7)
Eosinophils Relative: 5.4 % — ABNORMAL HIGH (ref 0.0–5.0)
HCT: 40.7 % (ref 39.0–52.0)
Hemoglobin: 13.8 g/dL (ref 13.0–17.0)
Lymphocytes Relative: 25.5 % (ref 12.0–46.0)
Lymphs Abs: 1.5 10*3/uL (ref 0.7–4.0)
MCHC: 34 g/dL (ref 30.0–36.0)
MCV: 88.4 fl (ref 78.0–100.0)
Monocytes Absolute: 0.6 10*3/uL (ref 0.1–1.0)
Monocytes Relative: 9.8 % (ref 3.0–12.0)
Neutro Abs: 3.4 10*3/uL (ref 1.4–7.7)
Neutrophils Relative %: 58.1 % (ref 43.0–77.0)
Platelets: 503 10*3/uL — ABNORMAL HIGH (ref 150.0–400.0)
RBC: 4.61 Mil/uL (ref 4.22–5.81)
RDW: 14.7 % (ref 11.5–15.5)
WBC: 5.8 10*3/uL (ref 4.0–10.5)

## 2023-05-05 LAB — HEPATIC FUNCTION PANEL
ALT: 19 U/L (ref 0–53)
AST: 24 U/L (ref 0–37)
Albumin: 4.4 g/dL (ref 3.5–5.2)
Alkaline Phosphatase: 66 U/L (ref 39–117)
Bilirubin, Direct: 0.1 mg/dL (ref 0.0–0.3)
Total Bilirubin: 0.5 mg/dL (ref 0.2–1.2)
Total Protein: 7.2 g/dL (ref 6.0–8.3)

## 2023-05-05 LAB — BASIC METABOLIC PANEL
BUN: 29 mg/dL — ABNORMAL HIGH (ref 6–23)
CO2: 27 meq/L (ref 19–32)
Calcium: 9.2 mg/dL (ref 8.4–10.5)
Chloride: 105 meq/L (ref 96–112)
Creatinine, Ser: 1.22 mg/dL (ref 0.40–1.50)
GFR: 63.18 mL/min (ref >60.00)
Glucose, Bld: 92 mg/dL (ref 70–99)
Potassium: 4.1 meq/L (ref 3.5–5.1)
Sodium: 139 meq/L (ref 135–145)

## 2023-05-05 LAB — TSH: TSH: 6.43 u[IU]/mL — ABNORMAL HIGH (ref 0.35–5.50)

## 2023-05-05 LAB — LIPID PANEL
Cholesterol: 152 mg/dL (ref 0–200)
HDL: 36.1 mg/dL — ABNORMAL LOW (ref >39.00)
LDL Cholesterol: 99 mg/dL (ref 0–99)
NonHDL: 115.61
Total CHOL/HDL Ratio: 4
Triglycerides: 84 mg/dL (ref 0.0–149.0)
VLDL: 16.8 mg/dL (ref 0.0–40.0)

## 2023-05-05 MED ORDER — METHOCARBAMOL 500 MG PO TABS: 500.0000 mg | ORAL_TABLET | Freq: Three times a day (TID) | ORAL | 1 refills | Status: AC | PRN

## 2023-05-05 NOTE — Progress Notes (Signed)
   Subjective:    Patient ID: Gregory Webb, male    DOB: 1960-10-20, 63 y.o.   MRN: [id number]  HPI CPE- UTD on colonoscopy, Tdap  Patient Care Team    Relationship Specialty Notifications Start End  Sheliah Hatch, MD PCP - General Family Medicine  05/07/14   Iva Boop, MD Consulting Physician Gastroenterology  02/02/15   Esaw Dace, MD Attending Physician Urology  11/02/16   Windell Norfolk, MD Consulting Physician Neurology  05/05/23   Esaw Dace, MD Attending Physician Urology  05/05/23      Health Maintenance  Topic Date Due   Zoster Vaccines- Shingrix (1 of 2) Never done   INFLUENZA VACCINE  10/06/2022   COVID-19 Vaccine (3 - 2024-25 season) 11/06/2022   Colonoscopy  09/28/2025   DTaP/Tdap/Td (3 - Td or Tdap) 01/14/2029   Hepatitis C Screening  Completed   HIV Screening  Completed   HPV VACCINES  Aged Out      Review of Systems Patient reports no vision/hearing changes, anorexia, fever ,adenopathy, persistant/recurrent hoarseness, swallowing issues, chest pain, palpitations, edema, persistant/recurrent cough, hemoptysis, dyspnea (rest,exertional, paroxysmal nocturnal), gastrointestinal  bleeding (melena, rectal bleeding), abdominal pain, excessive heart burn, GU symptoms (dysuria, hematuria, voiding/incontinence issues) syncope, focal weakness, memory loss, numbness & tingling, skin/hair/nail changes, depression, anxiety, abnormal bruising/bleeding, musculoskeletal symptoms/signs.   + 13 lb weight gain    Objective:   Physical Exam General Appearance:    Alert, cooperative, no distress, appears stated age  Head:    Normocephalic, without obvious abnormality, atraumatic  Eyes:    PERRL, conjunctiva/corneas clear, EOM's intact both eyes       Ears:    Normal TM's and external ear canals, both ears  Nose:   Nares normal, septum midline, mucosa normal, no drainage   or sinus tenderness  Throat:   Lips, mucosa, and tongue normal; teeth and gums  normal  Neck:   Supple, symmetrical, trachea midline, no adenopathy;       thyroid:  No enlargement/tenderness/nodules  Back:     Symmetric, no curvature, ROM normal, no CVA tenderness  Lungs:     Clear to auscultation bilaterally, respirations unlabored  Chest wall:    No tenderness or deformity  Heart:    Regular rate and rhythm, S1 and S2 normal, no murmur, rub   or gallop  Abdomen:     Soft, non-tender, bowel sounds active all four quadrants,    no masses, no organomegaly  Genitalia:    deferred  Rectal:    Extremities:   Extremities normal, atraumatic, no cyanosis or edema  Pulses:   2+ and symmetric all extremities  Skin:   Skin color, texture, turgor normal, no rashes or lesions  Lymph nodes:   Cervical, supraclavicular, and axillary nodes normal  Neurologic:   CNII-XII intact. Normal strength, sensation and reflexes      throughout          Assessment & Plan:

## 2023-05-05 NOTE — Assessment & Plan Note (Signed)
 New.  Dx'd by Vaughan Sine last month after biopsy.  Due to small area involved, no treatment at this time.

## 2023-05-05 NOTE — Telephone Encounter (Signed)
 Printed and attached charge sheet placed in your sign folder

## 2023-05-05 NOTE — Assessment & Plan Note (Signed)
 Pt's PE WNL.  UTD on colonoscopy, Tdap.  Check labs.  Anticipatory guidance provided.

## 2023-05-05 NOTE — Assessment & Plan Note (Signed)
Chronic problem.  Check labs.  Adjust meds prn  

## 2023-05-05 NOTE — Patient Instructions (Addendum)
 Follow up in 6 months to recheck thyroid We'll notify you of your lab results and make any changes if needed Continue to work on healthy diet and regular exercise- you're doing great! Call with any questions or concerns Stay Safe!  Stay Healthy! Hang in there!!!

## 2023-05-08 ENCOUNTER — Other Ambulatory Visit: Payer: Self-pay

## 2023-05-08 ENCOUNTER — Telehealth: Payer: Self-pay

## 2023-05-08 ENCOUNTER — Encounter: Payer: Self-pay | Admitting: Family Medicine

## 2023-05-08 DIAGNOSIS — E039 Hypothyroidism, unspecified: Secondary | ICD-10-CM

## 2023-05-08 MED ORDER — LEVOTHYROXINE SODIUM 125 MCG PO TABS: 125.0000 ug | ORAL_TABLET | Freq: Every day | ORAL | 3 refills | Status: DC

## 2023-05-08 NOTE — Telephone Encounter (Signed)
-----   Message from Neena Rhymes sent at 05/08/2023  7:52 AM EST ----- Labs are stable and look good w/ exception of mildly elevated TSH.  Based on this, we will increase your Levothyroxine dose to daily (#30, 3 refills) and repeat your TSH level at a lab only visit in 1 month (TSH, dx hypothyroid)

## 2023-05-08 NOTE — Telephone Encounter (Signed)
 Pt has lab appt 06/08/2023 No note was put in when appt was made.FYI

## 2023-05-10 ENCOUNTER — Telehealth: Payer: Self-pay

## 2023-05-10 NOTE — Telephone Encounter (Signed)
 Faxed and placed in scan bin

## 2023-05-30 ENCOUNTER — Encounter: Payer: Self-pay | Admitting: Family Medicine

## 2023-05-30 NOTE — Telephone Encounter (Signed)
 I am requesting MRI from Dr Earlene Plater office however also wanted to ask you if this is something you can review and comment on.

## 2023-06-01 ENCOUNTER — Other Ambulatory Visit: Payer: Self-pay | Admitting: Internal Medicine

## 2023-06-01 DIAGNOSIS — K219 Gastro-esophageal reflux disease without esophagitis: Secondary | ICD-10-CM

## 2023-06-08 ENCOUNTER — Other Ambulatory Visit (INDEPENDENT_AMBULATORY_CARE_PROVIDER_SITE_OTHER)

## 2023-06-08 ENCOUNTER — Encounter: Payer: Self-pay | Admitting: Family Medicine

## 2023-06-08 DIAGNOSIS — E039 Hypothyroidism, unspecified: Secondary | ICD-10-CM

## 2023-06-08 LAB — TSH: TSH: 3.4 u[IU]/mL (ref 0.35–5.50)

## 2023-06-08 NOTE — Telephone Encounter (Signed)
-----   Message from Neena Rhymes sent at 06/08/2023 12:48 PM EDT ----- TSH is now normal- great news!  No changes at this time

## 2023-06-08 NOTE — Telephone Encounter (Signed)
 Lab results have been discussed.   Verbalized understanding? Yes  Are there any questions? No

## 2023-07-09 ENCOUNTER — Other Ambulatory Visit: Payer: Self-pay | Admitting: Internal Medicine

## 2023-07-09 DIAGNOSIS — K219 Gastro-esophageal reflux disease without esophagitis: Secondary | ICD-10-CM

## 2023-08-03 ENCOUNTER — Ambulatory Visit: Payer: Managed Care, Other (non HMO) | Admitting: Neurology

## 2023-08-03 ENCOUNTER — Encounter: Payer: Self-pay | Admitting: Neurology

## 2023-10-04 ENCOUNTER — Other Ambulatory Visit: Payer: Self-pay | Admitting: Internal Medicine

## 2023-10-04 DIAGNOSIS — K219 Gastro-esophageal reflux disease without esophagitis: Secondary | ICD-10-CM

## 2023-10-17 ENCOUNTER — Other Ambulatory Visit: Payer: Self-pay

## 2023-10-17 MED ORDER — LEVOTHYROXINE SODIUM 125 MCG PO TABS: 125.0000 ug | ORAL_TABLET | Freq: Every day | ORAL | 3 refills | Status: AC

## 2023-11-30 HISTORY — PX: PROSTATE SURGERY: SHX751

## 2024-01-06 ENCOUNTER — Other Ambulatory Visit: Payer: Self-pay | Admitting: Internal Medicine

## 2024-01-06 DIAGNOSIS — K219 Gastro-esophageal reflux disease without esophagitis: Secondary | ICD-10-CM

## 2024-01-26 ENCOUNTER — Telehealth: Payer: Self-pay | Admitting: Internal Medicine

## 2024-01-26 DIAGNOSIS — K219 Gastro-esophageal reflux disease without esophagitis: Secondary | ICD-10-CM

## 2024-01-26 MED ORDER — OMEPRAZOLE 20 MG PO CPDR: 20.0000 mg | DELAYED_RELEASE_CAPSULE | Freq: Every day | ORAL | 0 refills | Status: DC

## 2024-01-26 NOTE — Telephone Encounter (Signed)
Omeprazole refilled as requested. 

## 2024-01-26 NOTE — Telephone Encounter (Signed)
 Patient requesting refill for omeprazole . Patient is scheduled for 1/8. Please advise, thank you

## 2024-02-05 DIAGNOSIS — C801 Malignant (primary) neoplasm, unspecified: Secondary | ICD-10-CM

## 2024-02-05 HISTORY — DX: Malignant (primary) neoplasm, unspecified: C80.1

## 2024-03-11 ENCOUNTER — Encounter: Payer: Self-pay | Admitting: Family Medicine

## 2024-03-11 NOTE — Telephone Encounter (Signed)
 Could the change of color of his levothyroxine  cause a reaction? 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

## 2024-03-14 ENCOUNTER — Encounter: Payer: Self-pay | Admitting: Internal Medicine

## 2024-03-14 ENCOUNTER — Encounter: Payer: Self-pay | Admitting: Gastroenterology

## 2024-03-14 ENCOUNTER — Ambulatory Visit: Admitting: Gastroenterology

## 2024-03-14 VITALS — BP 130/84 | HR 55 | Ht 72.0 in | Wt 189.0 lb

## 2024-03-14 DIAGNOSIS — K5900 Constipation, unspecified: Secondary | ICD-10-CM

## 2024-03-14 DIAGNOSIS — R1310 Dysphagia, unspecified: Secondary | ICD-10-CM | POA: Insufficient documentation

## 2024-03-14 DIAGNOSIS — K219 Gastro-esophageal reflux disease without esophagitis: Secondary | ICD-10-CM | POA: Diagnosis not present

## 2024-03-14 DIAGNOSIS — Z8719 Personal history of other diseases of the digestive system: Secondary | ICD-10-CM

## 2024-03-14 MED ORDER — OMEPRAZOLE 20 MG PO CPDR: 20.0000 mg | DELAYED_RELEASE_CAPSULE | Freq: Two times a day (BID) | ORAL | 3 refills | Status: AC

## 2024-03-14 NOTE — Progress Notes (Signed)
 "    03/14/2024 Gregory Webb [id number] May 02, 1960   Discussed the use of AI scribe software for clinical note transcription with the patient, who gave verbal consent to proceed.  History of Present Illness Gregory Webb is a 64 year old male with gastroesophageal reflux disease and history of esophageal stricture who presents for evaluation of persistent reflux symptoms.  Reflux symptoms have persisted despite omeprazole  20 mg once daily. He sometimes increases the dose to twice daily, and rarely three times in one day, but still experiences inadequate symptom control. Doses are typically taken at 7-8 PM, which corresponds to his morning due to working third shift. He notes that symptom relief lasts 8-10 hours, with recurrence by 10-12 hours.  Esophageal dilation was performed in 2012 for stricture. Since then, dysphagia has been infrequent, with only one or two episodes in since his last appointment here in 2022.  Has got choked and coughed up some bile, but these episodes are infrequent.  No associated weight loss. Weight fluctuates between 189 and 200 pounds, which he attributes to his work schedule and inconsistent eating habits. He prefers to keep his weight below 200 pounds.  He thinks constipation occurs when omeprazole  is taken twice daily, though increased water intake has helped. He denies diarrhea.  EGD 06/2010: Thoracic inlet pouches, esophageal ring dilated, mild gastritis.  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    Past Medical History:  Diagnosis Date   Adenomatous colon polyp    Allergic rhinitis    Asthma    prn Combivent seasonal    Diverticulosis    GERD (gastroesophageal reflux disease)    Hemorrhoids    Hyperlipidemia 08/2009   LDL 128, HDL 49.40,TG 78. No premature CAD/MI in FH   Hypothyroidism    Low back pain syndrome    Lower esophageal ring 06/2010    dilated   Prostatitis    X3; Dr Renay Moats   Past Surgical History:  Procedure Laterality Date   COLONOSCOPY  06/08/2010   1 adenoma, mild diverticulosis and hemorrhoids plan for repeat 2017 approximately   INGUINAL HERNIA REPAIR Bilateral 04/03/2018   Procedure: LAPAROSCOPIC BILATERAL INGUINAL HERNIA REPAIR WITH MESH ERAS PATHWAY;  Surgeon: Gladis Cough, MD;  Location: [facility];  Service: General;  Laterality: Bilateral;   PROSTATE SURGERY  11/30/2023   in [place]   SHOULDER SURGERY  2008   right   SHOULDER SURGERY Left 2018   UPPER GASTROINTESTINAL ENDOSCOPY  06/08/2010   Lower esophageal ring, dilated to 50 French. Mild gastritis, thoracic inlet patch.    reports that he quit smoking about 43 years ago. His smoking use included cigarettes. He has quit using smokeless tobacco. He reports current alcohol use of about 3.0 - 4.0 standard drinks of alcohol per week. He reports that he does not use drugs. family history includes Breast cancer in his mother; Colon cancer in his paternal grandmother; Heart attack in his paternal grandfather; Hyperlipidemia in his mother; Melanoma in his mother; Ovarian cancer in his sister; Stroke in his maternal grandfather and maternal grandmother. Allergies[1]    Outpatient Encounter Medications as of 03/14/2024  Medication Sig   albuterol  (VENTOLIN  HFA) 108 (90 Base) MCG/ACT inhaler Inhale 2 puffs into the lungs every 4 (four) hours as needed for wheezing or shortness of breath.   amitriptyline  (ELAVIL ) 25 MG tablet Take 1 tablet (25 mg total) by mouth at bedtime.  fluticasone  (FLONASE ) 50 MCG/ACT nasal spray Place 2 sprays into both nostrils daily.   Ipratropium-Albuterol  (COMBIVENT) 20-100 MCG/ACT AERS respimat Please use 1 puff every 4-6 hours as needed.   levothyroxine  (SYNTHROID ) 125 MCG tablet Take 1 tablet (125 mcg total) by mouth daily.   methocarbamol  (ROBAXIN ) 500 MG tablet Take 1 tablet (500 mg total) by mouth every 8 (eight) hours as  needed for muscle spasms.   omeprazole  (PRILOSEC) 20 MG capsule Take 1 capsule (20 mg total) by mouth daily.   cetirizine  (ZYRTEC ) 10 MG tablet Take 1 tablet (10 mg total) by mouth daily. (Patient not taking: Reported on 03/14/2024)   No facility-administered encounter medications on file as of 03/14/2024.     REVIEW OF SYSTEMS  : All other systems reviewed and negative except where noted in the History of Present Illness.   PHYSICAL EXAM: BP 130/84   Pulse (!) 55   Ht 6' (1.829 m)   Wt 189 lb (85.7 kg)   BMI 25.63 kg/m  General: Well developed white male in no acute distress Head: Normocephalic and atraumatic Eyes:  Sclerae anicteric, conjunctiva pink. Ears: Normal auditory acuity Lungs: Clear throughout to auscultation; no W/R/R. Heart: Regular rate and rhythm; no M/R/G. Abdomen: Soft, non-distended.  BS present.  Non-tender. Musculoskeletal: Symmetrical with no gross deformities  Skin: No lesions on visible extremities Neurological: Alert oriented x 4, grossly non-focal Psychological:  Alert and cooperative. Normal mood and affect  Assessment & Plan Gastroesophageal reflux disease Chronic GERD symptoms with suboptimal control on current regimen. No alarm symptoms or significant weight loss. Long interval since last endoscopic evaluation. - Increased omeprazole  to 20 mg twice daily for improved symptom control.  Prescription sent to pharmacy. - Advised to continue twice daily dosing for several months, then consider returning to once daily if symptoms are well controlled. - Refilled omeprazole  prescription for twice daily dosing. - Discussed and offered upper endoscopy due to long interval since last evaluation; he agreed to schedule endoscopy with Dr. Avram.  The risks, benefits, and alternatives to EGD with possible dilation were discussed with the patient and he consents to proceed.   History of esophageal ring Remote esophageal ring with rare, mild dysphagia and no current  significant swallowing difficulties. Long interval since last endoscopic assessment warrants reassessment. - Scheduled upper endoscopy to evaluate for recurrence of ring or other pathology.  Constipation Occasional constipation, possibly associated with increased omeprazole  dosing, though not a common adverse effect. Symptoms are neither persistent nor severe currently. - Recommended increased water, fruits, and vegetables as tolerated.   CC:  Tabori, Katherine E, MD       [1] No Known Allergies  "

## 2024-03-14 NOTE — Patient Instructions (Addendum)
 We have sent the following medications to your pharmacy for you to pick up at your convenience: Omeprazole  20 mg twice daily 30-60 minutes before breakfast and dinner.   You have been scheduled for an endoscopy. Please follow written instructions given to you at your visit today.  If you use inhalers (even only as needed), please bring them with you on the day of your procedure.  If you take any of the following medications, they will need to be adjusted prior to your procedure:   DO NOT TAKE 7 DAYS PRIOR TO TEST- Trulicity (dulaglutide) Ozempic, Wegovy (semaglutide) Mounjaro, Zepbound (tirzepatide) Bydureon Bcise (exanatide extended release)  DO NOT TAKE 1 DAY PRIOR TO YOUR TEST Rybelsus (semaglutide) Adlyxin (lixisenatide) Victoza (liraglutide) Byetta (exanatide) ___________________________________________________________________________

## 2024-03-20 ENCOUNTER — Ambulatory Visit (AMBULATORY_SURGERY_CENTER): Admitting: Internal Medicine

## 2024-03-20 ENCOUNTER — Encounter: Payer: Self-pay | Admitting: Internal Medicine

## 2024-03-20 VITALS — BP 112/69 | HR 50 | Temp 97.4°F | Resp 13 | Ht 72.0 in | Wt 189.0 lb

## 2024-03-20 DIAGNOSIS — K2289 Other specified disease of esophagus: Secondary | ICD-10-CM

## 2024-03-20 DIAGNOSIS — R1310 Dysphagia, unspecified: Secondary | ICD-10-CM

## 2024-03-20 DIAGNOSIS — K219 Gastro-esophageal reflux disease without esophagitis: Secondary | ICD-10-CM

## 2024-03-20 MED ORDER — SODIUM CHLORIDE 0.9 % IV SOLN: 500.0000 mL | Freq: Once | INTRAVENOUS | Status: DC

## 2024-03-20 NOTE — Progress Notes (Signed)
 History and Physical Interval Note:  03/20/2024 9:20 AM  Gregory Webb  has presented today for endoscopic procedure(s), with the diagnosis of  Encounter Diagnoses  Name Primary?   Gastroesophageal reflux disease without esophagitis Yes   Dysphagia, unspecified type   .  The various methods of evaluation and treatment have been discussed with the patient and/or family. After consideration of risks, benefits and other options for treatment, the patient has consented to  the endoscopic procedure(s).   The patient's history has been reviewed, patient examined, no change in status, stable for endoscopic procedure(s).  I have reviewed the patient's chart and labs.  Questions were answered to the patient's satisfaction.     Lupita CHARLENA Commander, MD, NOLIA

## 2024-03-20 NOTE — Progress Notes (Signed)
 Vss nad trans to pacu

## 2024-03-20 NOTE — Patient Instructions (Addendum)
 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  That is not a problem.  The rest of the exam looks fine also.  Continue taking your omeprazole  twice a day.  I appreciate the opportunity to care for you. Lupita CHARLENA Commander, MD, FACG  YOU HAD AN ENDOSCOPIC PROCEDURE TODAY AT THE [facility]:   Refer to the procedure report that was given to you for any specific questions about what was found during the examination.  If the procedure report does not answer your questions, please call your gastroenterologist to clarify.  If you requested that your care partner not be given the details of your procedure findings, then the procedure report has been included in a sealed envelope for you to review at your convenience later.  YOU SHOULD EXPECT: Some feelings of bloating in the abdomen. Passage of more gas than usual.  Walking can help get rid of the air that was put into your GI tract during the procedure and reduce the bloating. If you had a lower endoscopy (such as a colonoscopy or flexible sigmoidoscopy) you may notice spotting of blood in your stool or on the toilet paper. If you underwent a bowel prep for your procedure, you may not have a normal bowel movement for a few days.  Please Note:  You might notice some irritation and congestion in your nose or some drainage.  This is from the oxygen used during your procedure.  There is no need for concern and it should clear up in a day or so.  SYMPTOMS TO REPORT IMMEDIATELY:  Following upper endoscopy (EGD)  Vomiting of blood or coffee ground material  New chest pain or pain under the shoulder blades  Painful or persistently difficult swallowing  New shortness of breath  Fever of 100F or higher  Black, tarry-looking stools  For urgent or emergent issues, a gastroenterologist can be reached at any hour by calling [phone]. Do not use MyChart messaging for urgent concerns.     DIET:  We do recommend a small meal at first, but then you may proceed to your regular diet.  Drink plenty of fluids but you should avoid alcoholic beverages for 24 hours.  ACTIVITY:  You should plan to take it easy for the rest of today and you should NOT DRIVE or use heavy machinery until tomorrow (because of the sedation medicines used during the test).    FOLLOW UP: Our staff will call the number listed on your records the next business day following your procedure.  We will call around 7:15- 8:00 am to check on you and address any questions or concerns that you may have regarding the information given to you following your procedure. If we do not reach you, we will leave a message.     If any biopsies were taken you will be contacted by phone or by letter within the next 1-3 weeks.  Please call us  at [phone] if you have not heard about the biopsies in 3 weeks.    SIGNATURES/CONFIDENTIALITY: You and/or your care partner have signed paperwork which will be entered into your electronic medical record.  These signatures attest to the fact that that the information above on your After Visit Summary has been reviewed and is understood.  Full responsibility of the confidentiality of this discharge information lies with you and/or your care-partner.

## 2024-03-20 NOTE — Op Note (Signed)
 [facility] Patient Name: Gregory Webb Procedure Date: 03/20/2024 9:18 AM MRN: [id number] Endoscopist: Lupita FORBES Commander , MD, [id number] Age: 64 Referring MD:  Date of Birth: 1960/06/15 Gender: Male Account #: [id number] Procedure:                Upper GI endoscopy Indications:              Esophageal reflux symptoms that persist despite                            appropriate therapy Medicines:                Monitored Anesthesia Care Procedure:                Pre-Anesthesia Assessment:                           - Prior to the procedure, a History and Physical                            was performed, and patient medications and                            allergies were reviewed. The patient's tolerance of                            previous anesthesia was also reviewed. The risks                            and benefits of the procedure and the sedation                            options and risks were discussed with the patient.                            All questions were answered, and informed consent                            was obtained. Prior Anticoagulants: The patient has                            taken no anticoagulant or antiplatelet agents. ASA                            Grade Assessment: II - A patient with mild systemic                            disease. After reviewing the risks and benefits,                            the patient was deemed in satisfactory condition to                            undergo the procedure.  After obtaining informed consent, the endoscope was                            passed under direct vision. Throughout the                            procedure, the patient's blood pressure, pulse, and                            oxygen saturations were monitored continuously. The                            Olympus Scope SN [id number] was introduced through the                            mouth, and advanced to the second  part of duodenum.                            The upper GI endoscopy was accomplished without                            difficulty. The patient tolerated the procedure                            well. Scope In: Scope Out: Findings:                 Islands of salmon-colored mucosa were present at 22                            cm. Known thoracic inlet patch.                           The exam was otherwise without abnormality.                           The cardia and gastric fundus were normal on                            retroflexion. Complications:            No immediate complications. Estimated Blood Loss:     Estimated blood loss: none. Impression:               - Salmon-colored mucosa.                           - The examination was otherwise normal.                           - No specimens collected. Recommendation:           - Patient has a contact number available for                            emergencies. The signs and symptoms of potential  delayed complications were discussed with the                            patient. Return to normal activities tomorrow.                            Written discharge instructions were provided to the                            patient.                           - GERD prevention diet.                           - Continue present medications. Omeprazole  recently                            increased to 20 mg bid (from qd)                           - Follow an antireflux regimen. This includes:                           - Do not lie down for at least 3 to 4 hours after                            meals.                           - Raise the head of the bed 4 to 6 inches.                           - Decrease excess weight.                           - Avoid citrus juices and other acidic foods,                            alcohol, chocolate, mints, coffee and other                            caffeinated beverages,  carbonated beverages, fatty                            and fried foods.                           - Avoid tight-fitting clothing.                           - Avoid cigarettes and other tobacco products. Lupita FORBES Commander, MD 03/20/2024 9:47:49 AM This report has been signed electronically.

## 2024-03-20 NOTE — Progress Notes (Signed)
Updated medical record with pt

## 2024-03-21 ENCOUNTER — Telehealth: Payer: Self-pay

## 2024-03-21 NOTE — Telephone Encounter (Signed)
" °  Follow up Call-     03/20/2024    8:22 AM  Call back number  Post procedure Call Back phone  # [phone]  Permission to leave phone message Yes     Patient questions:  Do you have a fever, pain , or abdominal swelling? No. Pain Score  0 *  Have you tolerated food without any problems? Yes.    Have you been able to return to your normal activities? Yes.    Do you have any questions about your discharge instructions: Diet   No. Medications  No. Follow up visit  No.  Do you have questions or concerns about your Care? No.  Actions: * If pain score is 4 or above: No action needed, pain <4.   "
